# Patient Record
Sex: Female | Born: 1955 | Race: Black or African American | Hispanic: No | Smoking: Former smoker
Health system: Southern US, Community
[De-identification: ages and names within clinical notes are randomized; demographics above are authoritative.]

## PROBLEM LIST (undated history)

## (undated) DIAGNOSIS — I10 Essential (primary) hypertension: Secondary | ICD-10-CM

## (undated) DIAGNOSIS — I639 Cerebral infarction, unspecified: Secondary | ICD-10-CM

## (undated) DIAGNOSIS — E119 Type 2 diabetes mellitus without complications: Secondary | ICD-10-CM

## (undated) HISTORY — PX: OTHER SURGICAL HISTORY: SHX169

---

## 2009-04-25 IMAGING — CR DG CHEST 1V
1 series · 1 of 1 positions shown · non-contrast
Comparison: none

REASON FOR EXAM: SEVERE ABDOMINAL PAIN
COMMENTS:

PROCEDURE:     DXR - DXR CHEST 1 VIEWAP OR PA  - [DATE] [DATE]
RESULT:     PA erect view of the chest shows the lung fields to be clear.
The heart, mediastinal and osseous structures are normal appearance. No
subdiaphragmatic free air is seen.

[view not recorded]
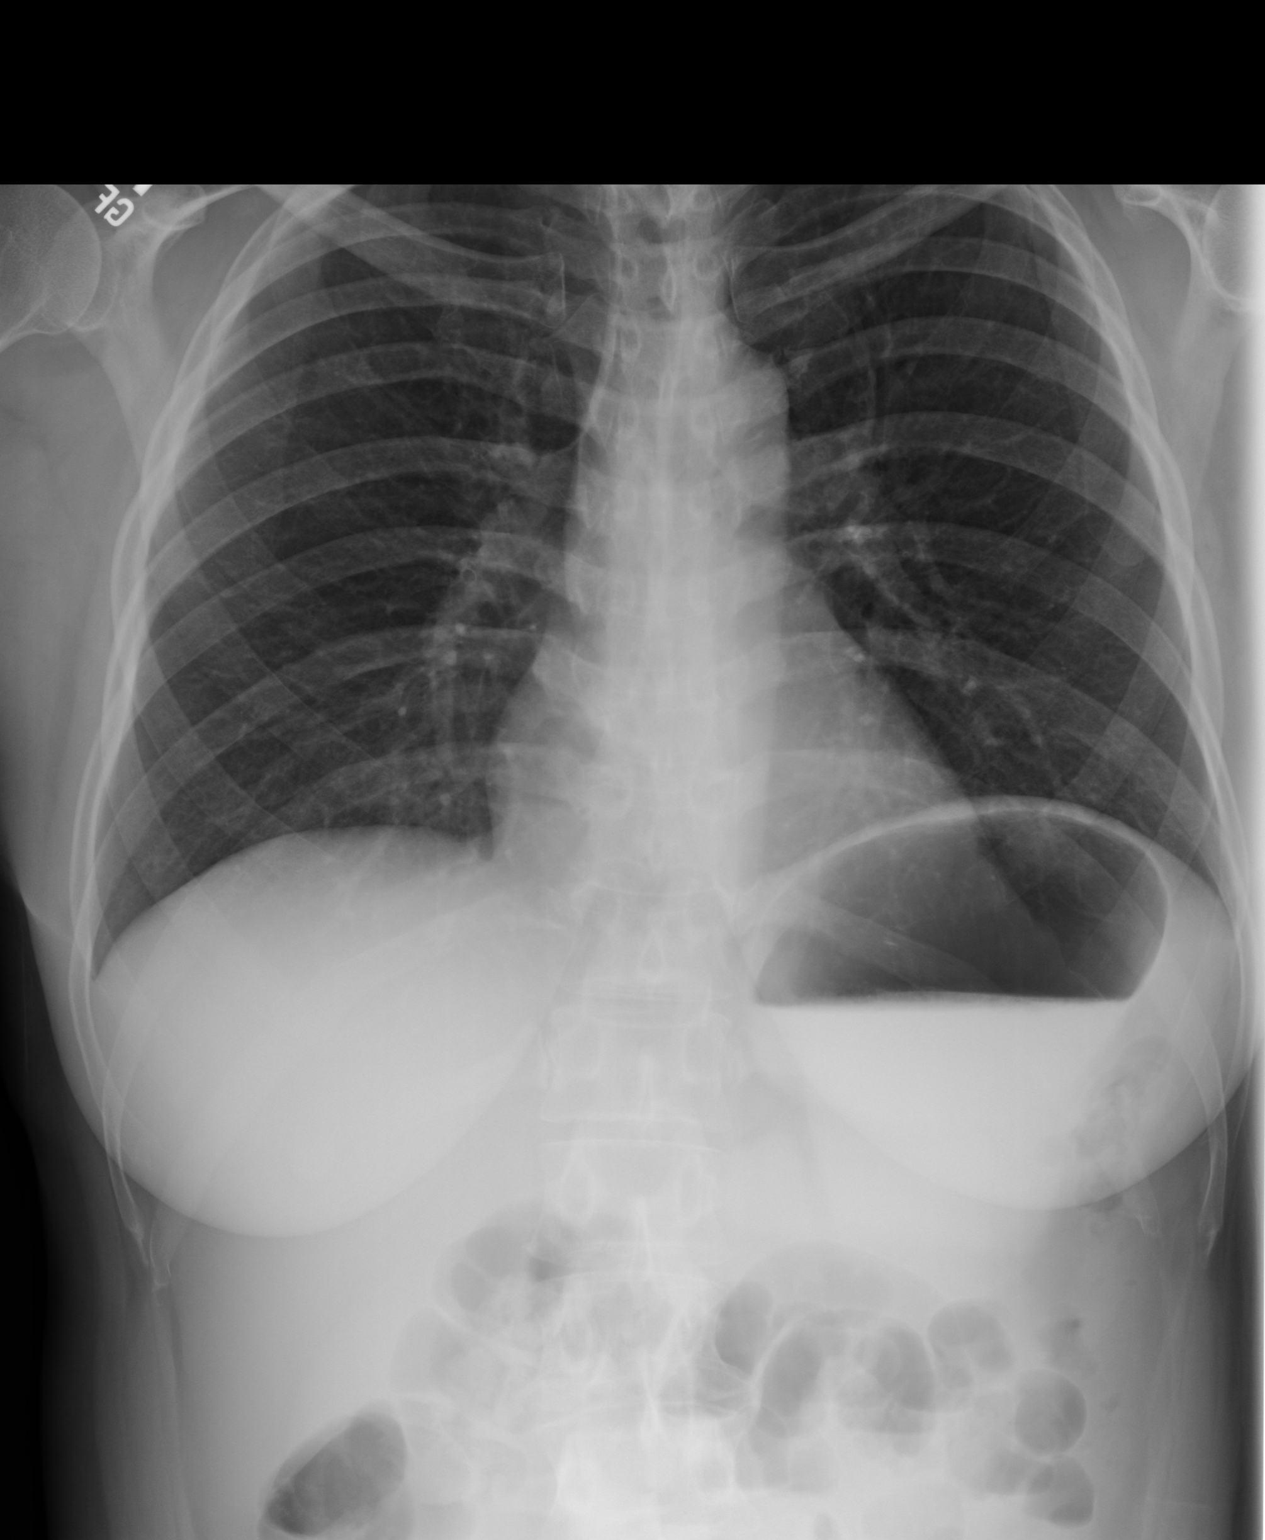

[1 of 1 positions shown; findings below may reference images not displayed]

IMPRESSION: No acute changes are identified.

## 2009-04-26 ENCOUNTER — Inpatient Hospital Stay: Payer: Self-pay | Admitting: Specialist

## 2009-04-26 IMAGING — CT CT ABD-PELV W/ CM
1 of 2 series · 14 of 32 positions shown, 18 images · non-contrast
Comparison: none

REASON FOR EXAM: (1) ABD PAIN; (2) ABD PAIN
COMMENTS:

[Series 2: abdomen · axial · 0.71mm/px · z∈[-1016,-602]mm · 14 of 95 slices shown, 18 images]
[im 8/95  soft-tissue]
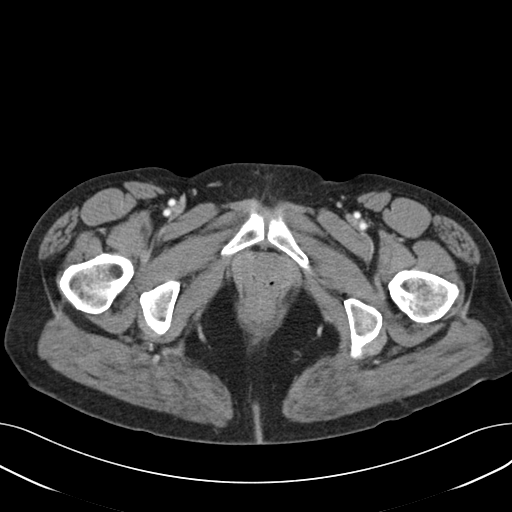
[im 8/95  bone]
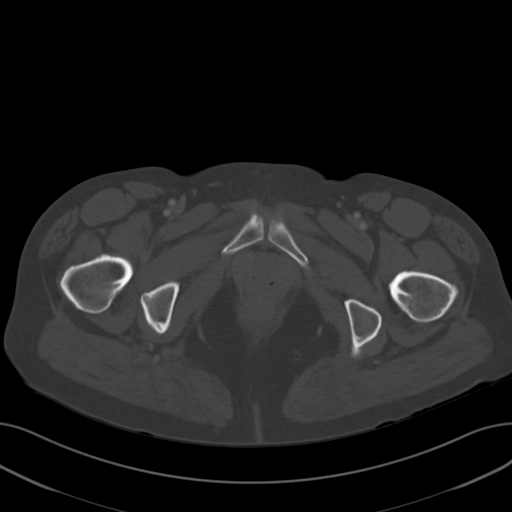
[im 16/95  soft-tissue]
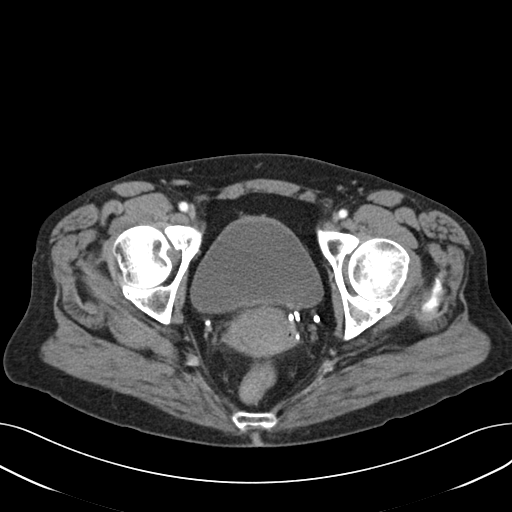
[im 23/95  soft-tissue]
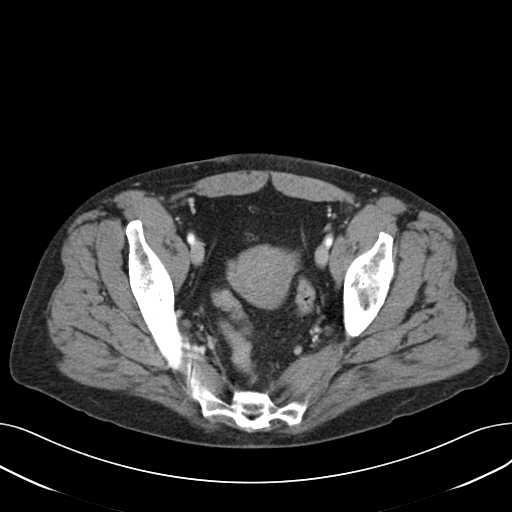
[im 31/95  soft-tissue]
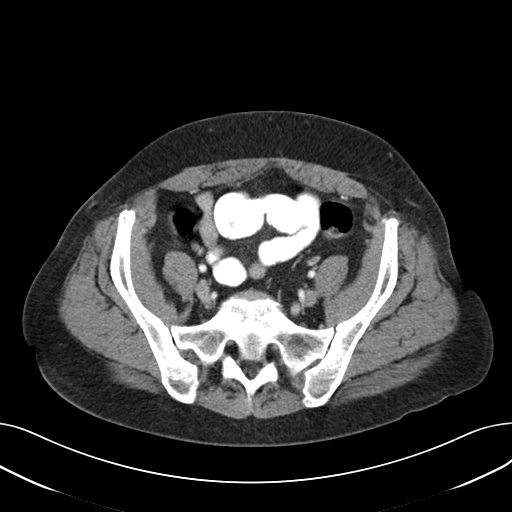
[im 38/95  soft-tissue]
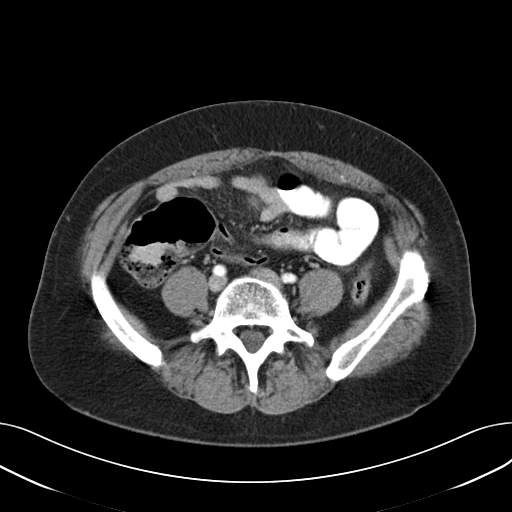
[im 46/95  soft-tissue]
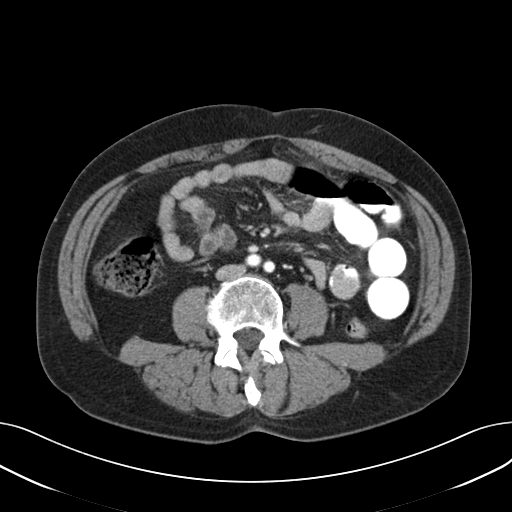
[im 53/95  soft-tissue]
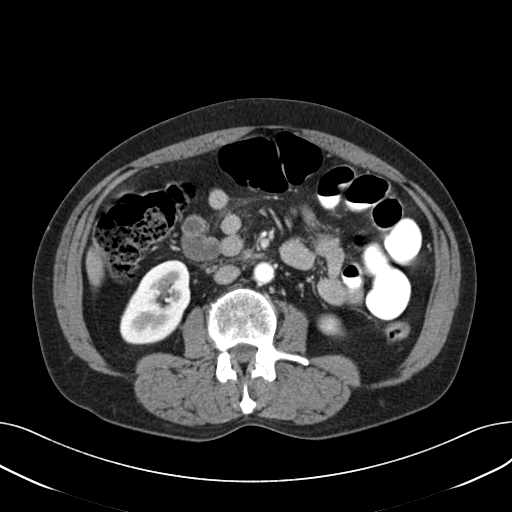
[im 61/95  soft-tissue]
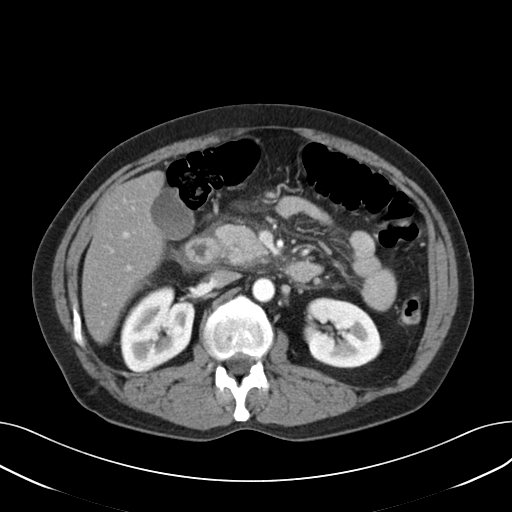
[im 68/95  soft-tissue]
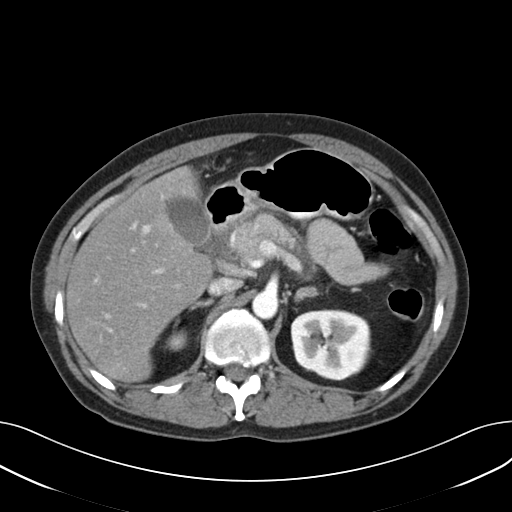
[im 68/95  bone]
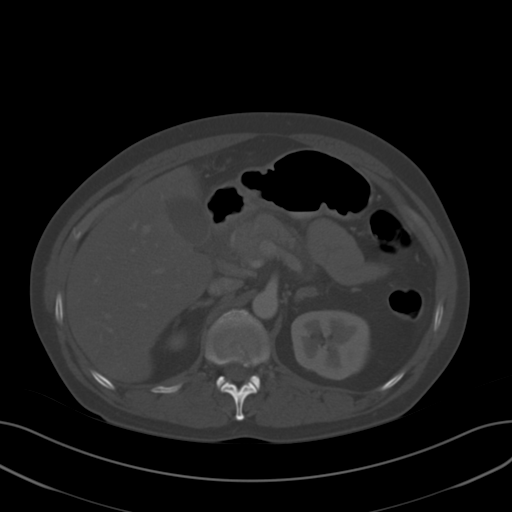
[im 76/95  soft-tissue]
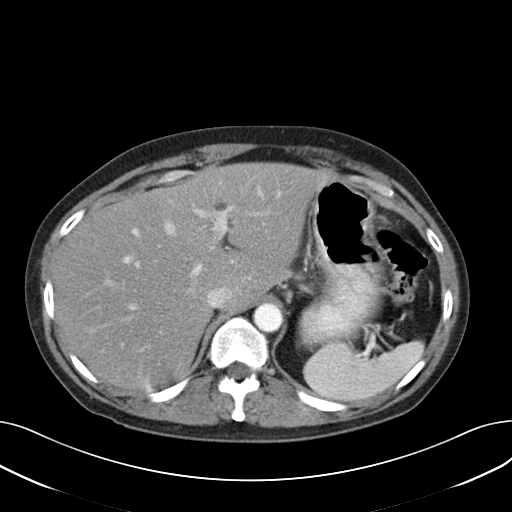
[im 79/95  lung]
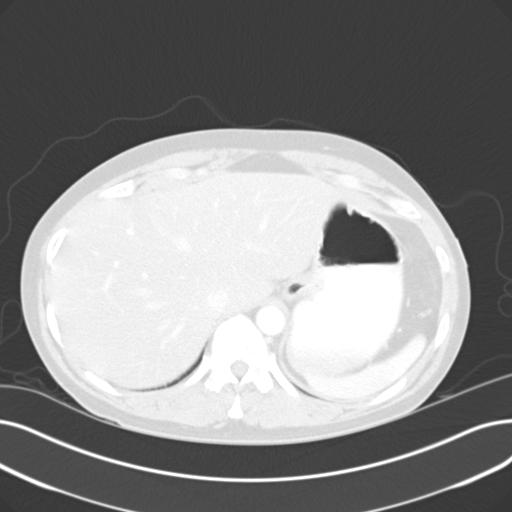
[im 83/95  soft-tissue]
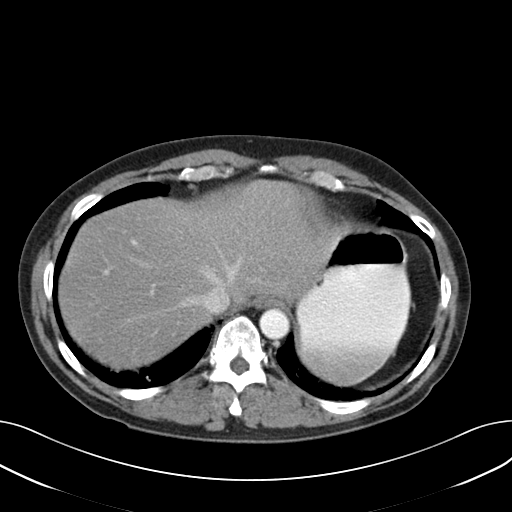
[im 83/95  lung]
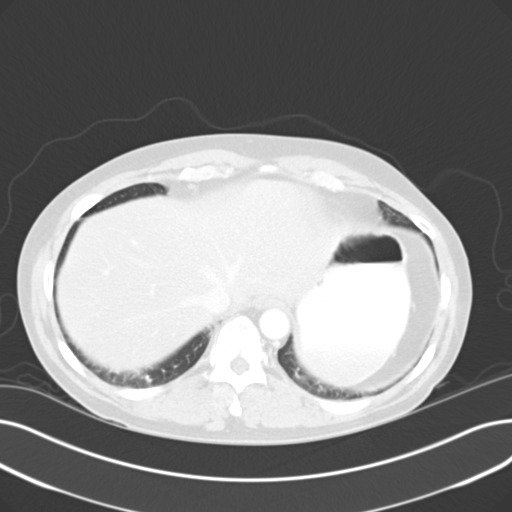
[im 87/95  lung]
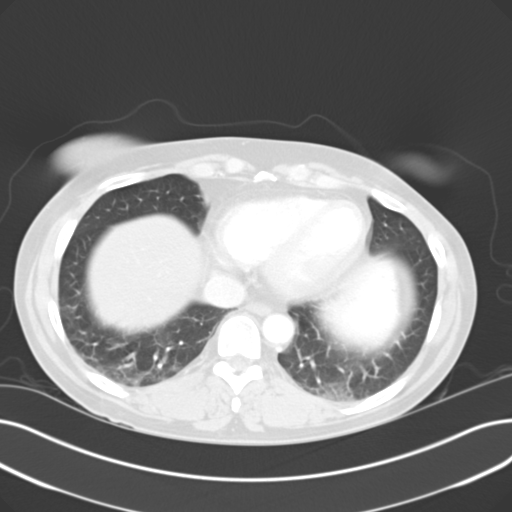
[im 91/95  soft-tissue]
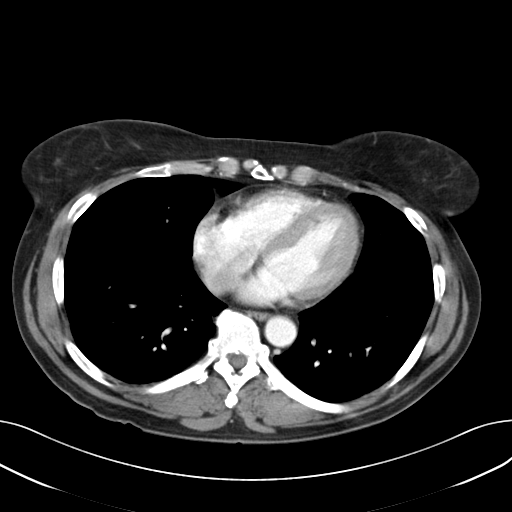
[im 91/95  lung]
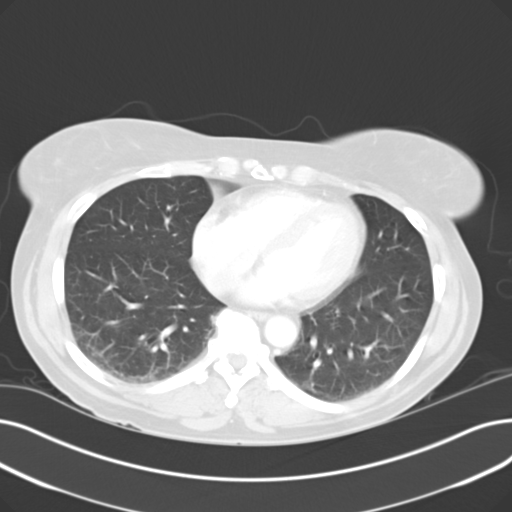

[14 of 32 positions shown; findings below may reference images not displayed]

PROCEDURE:     CT  - CT ABDOMEN / PELVIS  W  - [DATE]  [DATE]

RESULT:     Axial CT scanning was performed through the abdomen and pelvis
at 5 mm intervals and slice thicknesses following intravenous administration
of 100 cc of [G2]. Oral contrast was administered as well. Review of
3-dimensional reconstructed images was performed separately on the VIA
monitor.

There is edema of the pancreatic parenchyma. There is increased density in
the peripancreatic fat consistent with inflammation. There is no pancreatic
ductal dilation or focal pancreatic mass. A discrete pseudocyst is not
identified. Overall the appearance suggests that of acute pancreatitis. The
liver exhibits no focal mass nor ductal dilation. The hepatic parenchyma is
hypodense suggesting fatty infiltration. The gallbladder is adequately
distended with no evidence of calcified stones. The spleen, partially
distended stomach, adrenal glands, and kidneys exhibit no acute abnormality.
There is mild thickening of the wall of the descending portion of the
duodenum but no evidence of obstruction is seen.

The caliber of the abdominal aorta is normal. The periaortic and pericaval
regions are normal. The orally administered contrast has traversed
approximately one half of the small bowel. The pattern does not appear
obstructive however. The colon demonstrates a normal gas and stool pattern.
There is no evidence of free fluid in the pelvis. The urinary bladder,
uterus, and adnexal structures appear normal. There is likely uterine
fibroid exophytic from the posterior aspect of the uterus. There are
phleboliths within the pelvis.

The lumbar vertebral bodies are preserved in height. The lung bases exhibit
minimal dependent changes.
IMPRESSION: 1. The findings are consistent with acute pancreatitis with pancreatic
parenchymal edema fluid density in the peripancreatic fat. No pancreatic
masses identified.
2. There is mild thickening of the wall of the descending portion of the
duodenum. The duodenum is nonobstructive in appearance.
3. The small and large bowel do not exhibit evidence of obstruction
currently. The orally administered contrast has not traversed the small
bowel however.
4. There is no acute urinary tract abnormality nor acute abnormality of the
uterus or adnexal structure.

A pulmonary report was sent to the [HOSPITAL] the conclusion of
the study.

## 2015-05-14 ENCOUNTER — Encounter (HOSPITAL_COMMUNITY): Payer: Self-pay | Admitting: Emergency Medicine

## 2015-05-14 ENCOUNTER — Emergency Department (HOSPITAL_COMMUNITY)
Admission: EM | Admit: 2015-05-14 | Discharge: 2015-05-14 | Disposition: A | Discharge status: Home / Self Care | Payer: Self-pay | Attending: Emergency Medicine | Admitting: Emergency Medicine

## 2015-05-14 ENCOUNTER — Emergency Department (HOSPITAL_COMMUNITY): Payer: Self-pay

## 2015-05-14 DIAGNOSIS — L539 Erythematous condition, unspecified: Secondary | ICD-10-CM | POA: Insufficient documentation

## 2015-05-14 DIAGNOSIS — M7989 Other specified soft tissue disorders: Secondary | ICD-10-CM | POA: Insufficient documentation

## 2015-05-14 DIAGNOSIS — F17200 Nicotine dependence, unspecified, uncomplicated: Secondary | ICD-10-CM | POA: Insufficient documentation

## 2015-05-14 DIAGNOSIS — M1990 Unspecified osteoarthritis, unspecified site: Secondary | ICD-10-CM | POA: Insufficient documentation

## 2015-05-14 LAB — SYNOVIAL CELL COUNT + DIFF, W/ CRYSTALS
Crystals, Fluid: NONE SEEN
Eosinophils-Synovial: 0 % (ref 0–1)
Lymphocytes-Synovial Fld: 1 % (ref 0–20)
Monocyte-Macrophage-Synovial Fluid: 24 % — ABNORMAL LOW (ref 50–90)
Neutrophil, Synovial: 75 % — ABNORMAL HIGH (ref 0–25)
WBC, Synovial: 4270 /mm3 — ABNORMAL HIGH (ref 0–200)

## 2015-05-14 IMAGING — DX DG ANKLE COMPLETE 3+V*L*
3 series · 3 of 3 positions shown · non-contrast
Comparison: Or

CLINICAL DATA: 59-year-old female with acute onset left ankle
swelling without known injury

EXAM:
LEFT ANKLE COMPLETE - 3+ VIEW

[ankle ap]
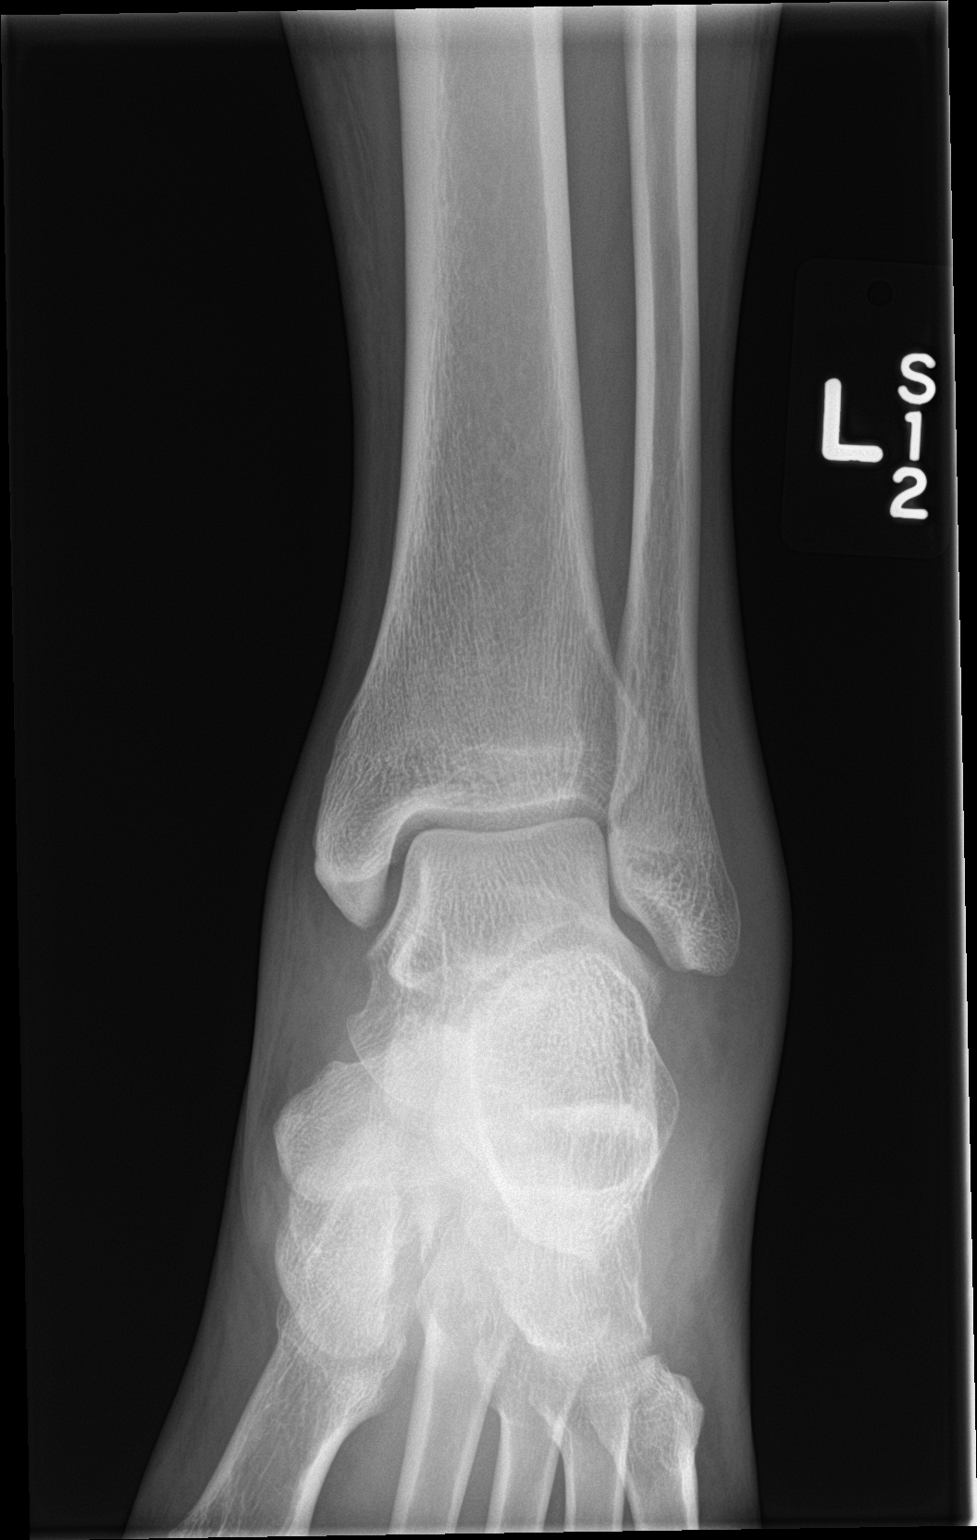

[ankle obl]
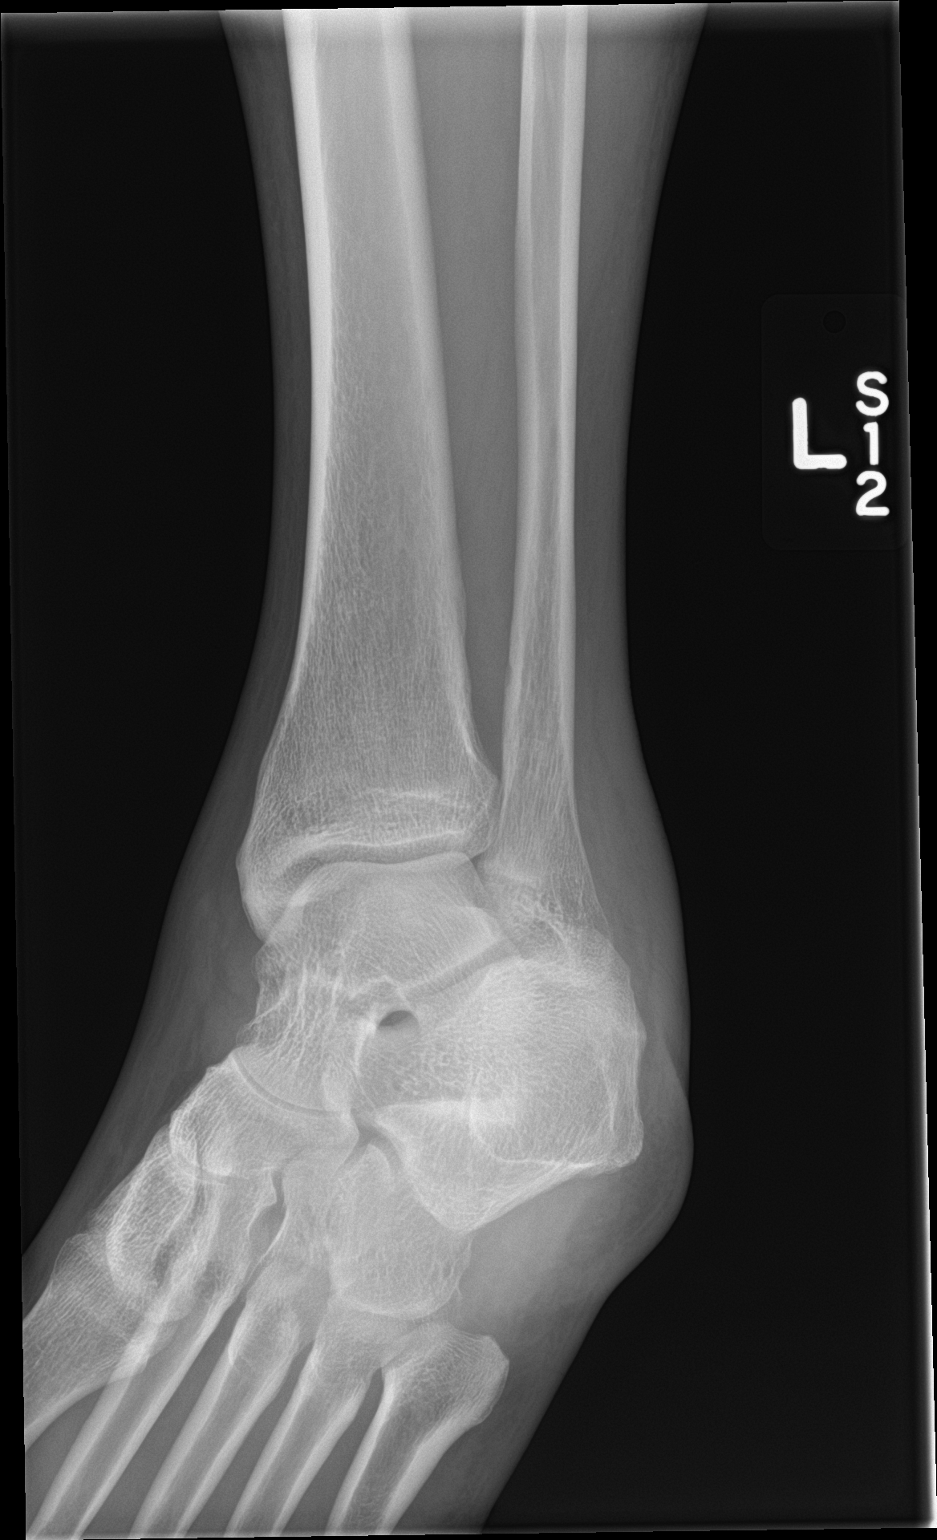

[ankle lat]
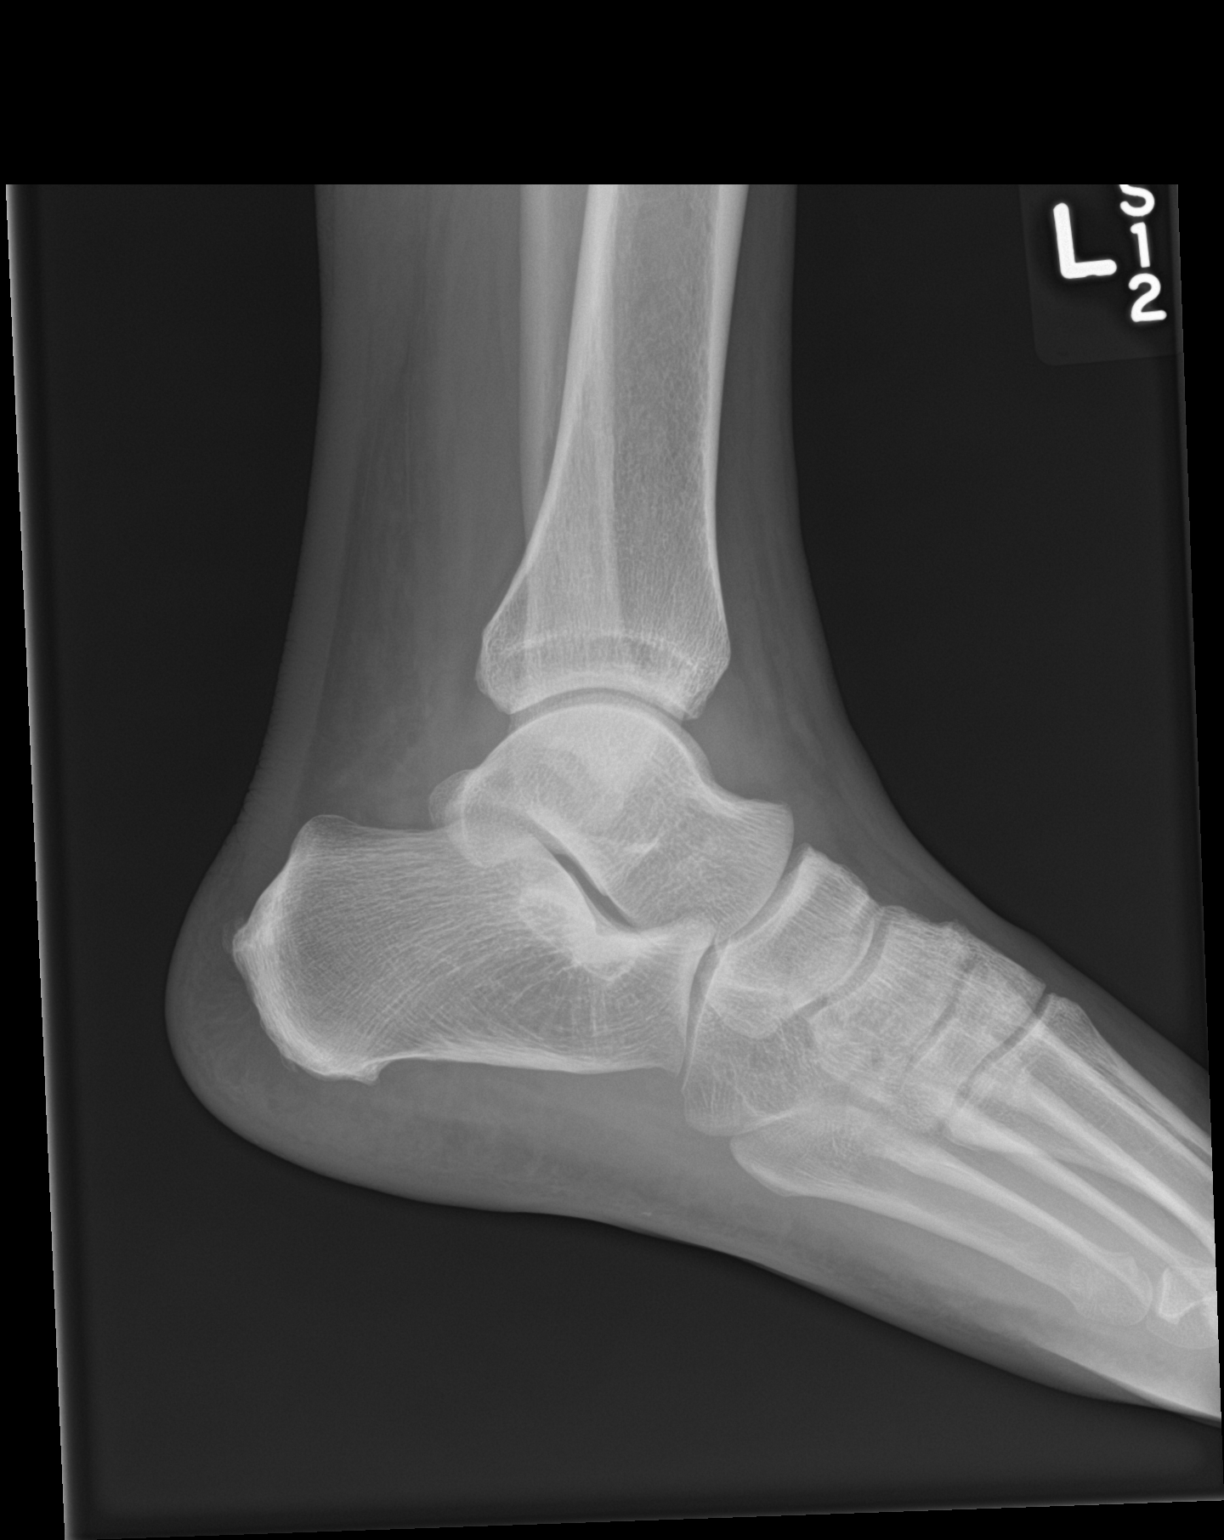

[3 of 3 positions shown; findings below may reference images not displayed]

FINDINGS: Positive for small ankle joint effusion. Diffuse mild soft tissue
swelling is present around the ankle. No evidence fracture,
malalignment or other bony abnormality. No degenerative or
destructive changes. Normal bony mineralization. No osseous lesions.
IMPRESSION: Suspect small ankle joint effusion with associated surrounding soft
tissue swelling.

No evidence of fracture, malalignment, degenerative change or
destructive changes. Differential considerations include
inflammatory effusion, early joint infection and potentially
hemarthrosis.

## 2015-05-14 MED ORDER — LIDOCAINE HCL (PF) 1 % IJ SOLN
5.0000 mL | Freq: Once | INTRAMUSCULAR | Status: AC
  Administered 2015-05-14: 5 mL via INTRADERMAL
  Filled 2015-05-14: qty 5

## 2015-05-14 MED ORDER — ACETAMINOPHEN 325 MG PO TABS
650.0000 mg | ORAL_TABLET | Freq: Once | ORAL | Status: AC
  Administered 2015-05-14: 650 mg via ORAL
  Filled 2015-05-14: qty 2

## 2015-05-14 MED ORDER — NAPROXEN 500 MG PO TABS: 500.0000 mg | ORAL_TABLET | Freq: Two times a day (BID) | ORAL | Status: DC

## 2015-05-14 MED ORDER — LORAZEPAM 2 MG/ML IJ SOLN
INTRAMUSCULAR | Status: AC
  Filled 2015-05-14: qty 1

## 2015-05-14 NOTE — Discharge Instructions (Signed)
Use naproxen for pain. Return to ED with worsening swelling, worsening pain, warmth of the ankle, redness at the ankle, fevers, chills, dizziness, change in mental status, nausea, vomiting or any other new, worsening or concerning symptoms. Schedule follow up with orthopedics for Monday.    Arthritis Arthritis is a term that is commonly used to refer to joint pain or joint disease. There are more than 100 types of arthritis. CAUSES The most common cause of this condition is wear and tear of a joint. Other causes include:  Gout.  Inflammation of a joint.  An infection of a joint.  Sprains and other injuries near the joint.  A drug reaction or allergic reaction. In some cases, the cause may not be known. SYMPTOMS The main symptom of this condition is pain in the joint with movement. Other symptoms include:  Redness, swelling, or stiffness at a joint.  Warmth coming from the joint.  Fever.  Overall feeling of illness. DIAGNOSIS This condition may be diagnosed with a physical exam and tests, including:  Blood tests.  Urine tests.  Imaging tests, such as MRI, X-rays, or a CT scan. Sometimes, fluid is removed from a joint for testing. TREATMENT Treatment for this condition may involve:  Treatment of the cause, if it is known.  Rest.  Raising (elevating) the joint.  Applying cold or hot packs to the joint.  Medicines to improve symptoms and reduce inflammation.  Injections of a steroid such as cortisone into the joint to help reduce pain and inflammation. Depending on the cause of your arthritis, you may need to make lifestyle changes to reduce stress on your joint. These changes may include exercising more and losing weight. HOME CARE INSTRUCTIONS Medicines  Take over-the-counter and prescription medicines only as told by your health care provider.  Do not take aspirin to relieve pain if gout is suspected. Activities  Rest your joint if told by your health care  provider. Rest is important when your disease is active and your joint feels painful, swollen, or stiff.  Avoid activities that make the pain worse. It is important to balance activity with rest.  Exercise your joint regularly with range-of-motion exercises as told by your health care provider. Try doing low-impact exercise, such as:  Swimming.  Water aerobics.  Biking.  Walking. Joint Care  If your joint is swollen, keep it elevated if told by your health care provider.  If your joint feels stiff in the morning, try taking a warm shower.  If directed, apply heat to the joint. If you have diabetes, do not apply heat without permission from your health care provider.  Put a towel between the joint and the hot pack or heating pad.  Leave the heat on the area for 20-30 minutes.  If directed, apply ice to the joint:  Put ice in a plastic bag.  Place a towel between your skin and the bag.  Leave the ice on for 20 minutes, 2-3 times per day.  Keep all follow-up visits as told by your health care provider. This is important. SEEK MEDICAL CARE IF:  The pain gets worse.  You have a fever. SEEK IMMEDIATE MEDICAL CARE IF:  You develop severe joint pain, swelling, or redness.  Many joints become painful and swollen.  You develop severe back pain.  You develop severe weakness in your leg.  You cannot control your bladder or bowels.   This information is not intended to replace advice given to you by your health care  provider. Make sure you discuss any questions you have with your health care provider.   Document Released: 02/16/2004 Document Revised: 09/29/2014 Document Reviewed: 04/05/2014 Elsevier Interactive Patient Education [employer]

## 2015-05-14 NOTE — ED Notes (Signed)
Pt wants to go home .

## 2015-05-14 NOTE — ED Notes (Signed)
PT woke up yesterday with swelling over left ankle. No known injury. Swollen area is warm and non pitting. Tender to palpation. PT could not bear weight on extremity yesterday, but is tolerating limited weight today.

## 2015-05-14 NOTE — ED Provider Notes (Signed)
CSN: [id number]     Arrival date & time 05/14/15  1001 History   First MD Initiated Contact with Patient 05/14/15 1053     Chief Complaint  Patient presents with  . Ankle Pain   HPI  Ms. Brandi Rojas is a 60 year old female with no known past medical history presenting with left ankle pain and swelling. Patient reports onset of pain and swelling approximately one week ago. This worsened yesterday and she was unable to bear weight on the ankle. She states that today it has somewhat improved and she is able to limp around. The pain is located over the lateral aspect of her ankle. She states that it is mild-moderate in severity and aches. Pain does not radiate into the foot or lower leg. She is unsure if it has felt warm; states that maybe it has. She does believe that her skin looks slightly more pink over her left ankle compared to the right. She denies known trauma to the ankle. Denies history of gout or other arthritic shins. Denies medical problems or daily medications. She has not tried any medicines at home for this. Denies systemic symptoms including fever, chills, nausea or vomiting. She has no other complaints today.  History reviewed. No pertinent past medical history. History reviewed. No pertinent past surgical history. No family history on file. Social History  Substance Use Topics  . Smoking status: Current Every Day Smoker -- 0.50 packs/day  . Smokeless tobacco: None  . Alcohol Use: 4.2 oz/week    7 Cans of beer per week   OB History    No data available     Review of Systems  All other systems reviewed and are negative.     Allergies  Review of patient's allergies indicates no known allergies.  Home Medications   Prior to Admission medications   Medication Sig Start Date End Date Taking? Authorizing Provider  naproxen (NAPROSYN) 500 MG tablet Take 1 tablet (500 mg total) by mouth 2 (two) times daily. 05/14/15   Makinze Jani, PA-C   BP 137/93 mmHg  Pulse 72  Temp(Src)  98.8 F (37.1 C) (Oral)  Resp 18  SpO2 99%  LMP  Physical Exam  Constitutional: She appears well-developed and well-nourished. No distress.  Nontoxic-appearing  HENT:  Head: Normocephalic and atraumatic.  Right Ear: External ear normal.  Left Ear: External ear normal.  Eyes: Conjunctivae are normal. Right eye exhibits no discharge. Left eye exhibits no discharge. No scleral icterus.  Neck: Normal range of motion.  Cardiovascular: Normal rate and intact distal pulses.   Pedal pulse palpable  Pulmonary/Chest: Effort normal.  Musculoskeletal: Normal range of motion.  Tenderness to palpation of the lateral aspect of the left ankle. Moderate amount of swelling noted when compared to right. No pitting edema. Limited range of motion at the ankle secondary to pain. Full passive range of motion intact. No appreciable warmth of the joint. Extremely faint erythema over the lateral ankle. Remaining joints of the left lower extremity are nontender, without swelling and with full range of motion.  Neurological: She is alert. Coordination normal.  5/5 strength at the bilateral ankles. Sensation to light touch intact throughout.  Skin: Skin is warm and dry.  Psychiatric: She has a normal mood and affect. Her behavior is normal.  Nursing note and vitals reviewed.   ED Course  Procedures (including critical care time) Labs Review Labs Reviewed  SYNOVIAL CELL COUNT + DIFF, W/ CRYSTALS - Abnormal; Notable for the following:    Appearance-Synovial  TURBID (*)    WBC, Synovial 4270 (*)    Neutrophil, Synovial 75 (*)    Monocyte-Macrophage-Synovial Fluid 24 (*)    All other components within normal limits  BODY FLUID CULTURE  GLUCOSE, SYNOVIAL FLUID  RAPID HIV SCREEN (HIV 1/2 AB+AG)  HEPATITIS B SURFACE ANTIGEN  HEPATITIS C ANTIBODY (REFLEX)    Imaging Review Dg Ankle Complete Left  05/14/2015  CLINICAL DATA:  60 year old female with acute onset left ankle swelling without known injury EXAM:  LEFT ANKLE COMPLETE - 3+ VIEW COMPARISON:  Or FINDINGS: Positive for small ankle joint effusion. Diffuse mild soft tissue swelling is present around the ankle. No evidence fracture, malalignment or other bony abnormality. No degenerative or destructive changes. Normal bony mineralization. No osseous lesions. IMPRESSION: Suspect small ankle joint effusion with associated surrounding soft tissue swelling. No evidence of fracture, malalignment, degenerative change or destructive changes. Differential considerations include inflammatory effusion, early joint infection and potentially hemarthrosis. Electronically Signed   By: Malachy Moan M.D.   On: 05/14/2015 10:42   I have personally reviewed and evaluated these images and lab results as part of my medical decision-making.   EKG Interpretation None     During aspiration, accidental needle stick. Precautions and safety zone initiated.  MDM   Final diagnoses:  Inflammatory arthritis (HCC)   60 year old female presenting with left ankle pain and swelling 1 week. Afebrile and nontoxic appearing. Tenderness over lateral aspect of the ankle with moderate amount of swelling. No warmth or erythema. Restricted range of motion secondary to pain. Left foot is neurovascularly intact. X-ray reading shows small ankle joint effusion that could be consistent with inflammatory effusion, joint infection or hemarthrosis. Joint aspiration performed by Dr. Verdie Mosher. Cell count shows 4000 white blood cells and 75 neutrophils. Presentation and aspirate not consistent with septic arthritis. Likely has inflammatory arthritis. Will discharge with NSAIDs and symptomatic care. Pt is to follow up with orthopedics in 2 days. I have also given strict return precautions. Patient case reviewed with Dr. Verdie Mosher who agrees with assessment and plan. Return precautions given in discharge paperwork and discussed with pt at bedside. Pt stable for discharge     Alveta Heimlich, PA-C 05/14/15  1623  Lavera Guise, MD 05/14/15 [id number]

## 2015-05-15 LAB — GLUCOSE, SYNOVIAL FLUID: Glucose, Synovial Fluid: <20 mg/dL

## 2015-05-18 LAB — BODY FLUID CULTURE
Culture: NO GROWTH
Special Requests: NORMAL

## 2017-11-26 IMAGING — US BILATERAL CAROTID DUPLEX ULTRASOUND
1 series · 13 of 24 positions shown · non-contrast
Comparison: None.

CLINICAL DATA: Cerebral infarction.

EXAM:
BILATERAL CAROTID DUPLEX ULTRASOUND
TECHNIQUE: Gray scale imaging, color Doppler and duplex ultrasound were
performed of bilateral carotid and vertebral arteries in the neck.

[Series 1: bilateral carotid duplex ultrasound · 13 of 64 slices shown]
[im 1/64]
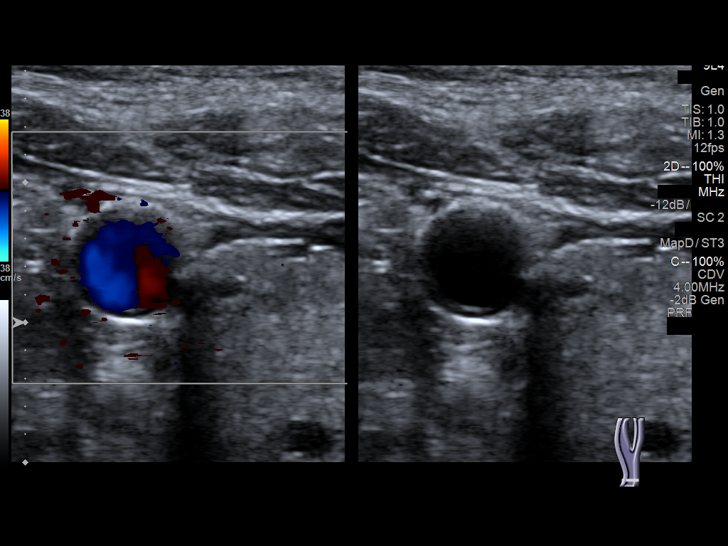
[im 6/64]
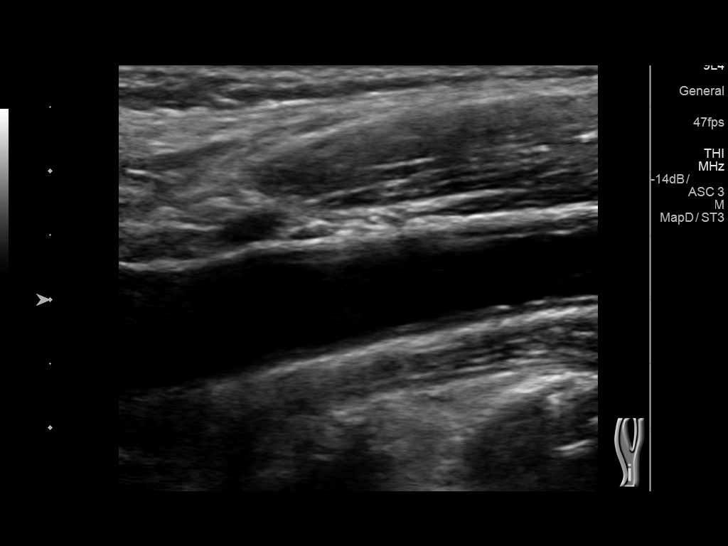
[im 11/64]
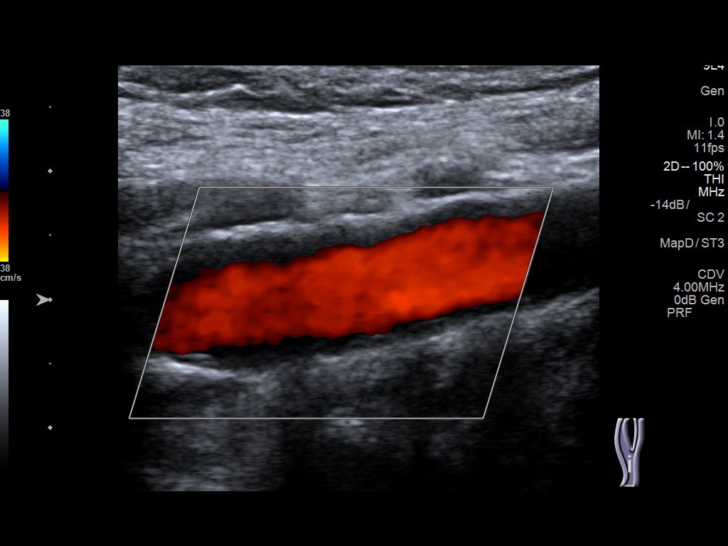
[im 17/64]
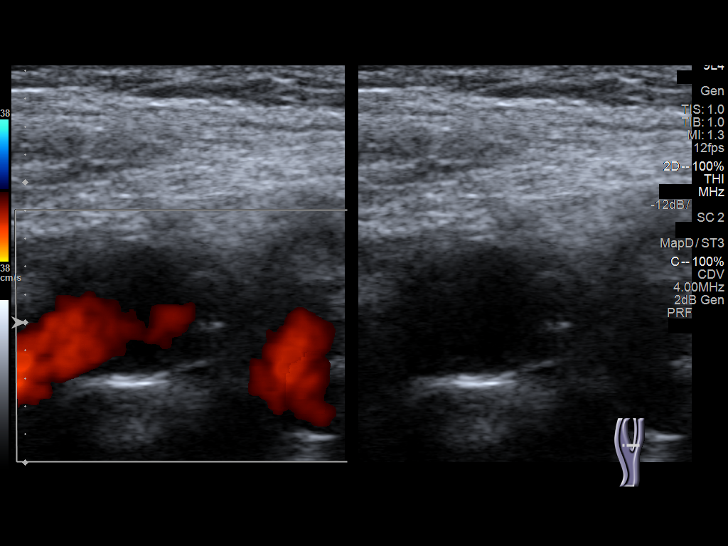
[im 22/64]
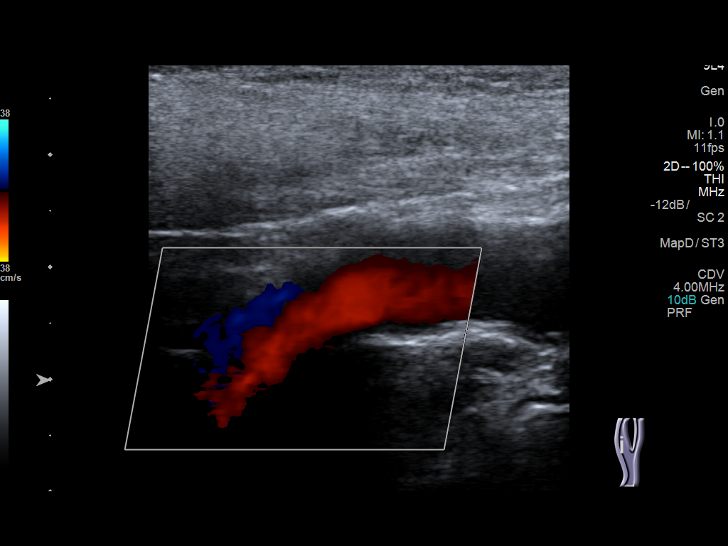
[im 28/64]
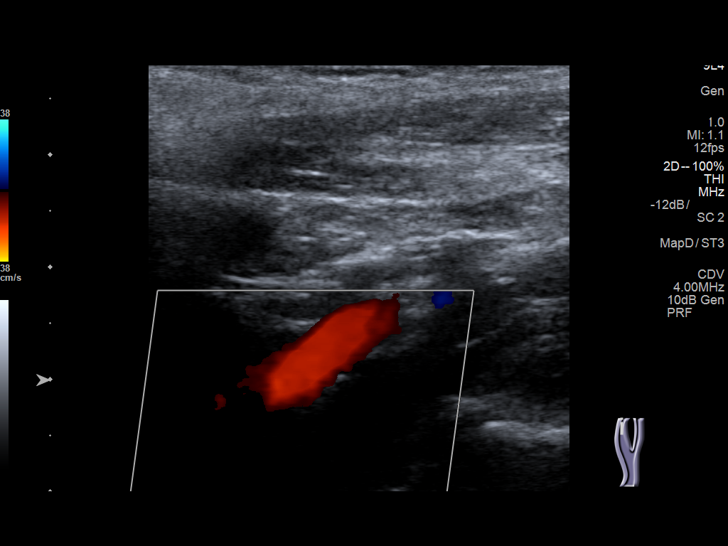
[im 33/64]
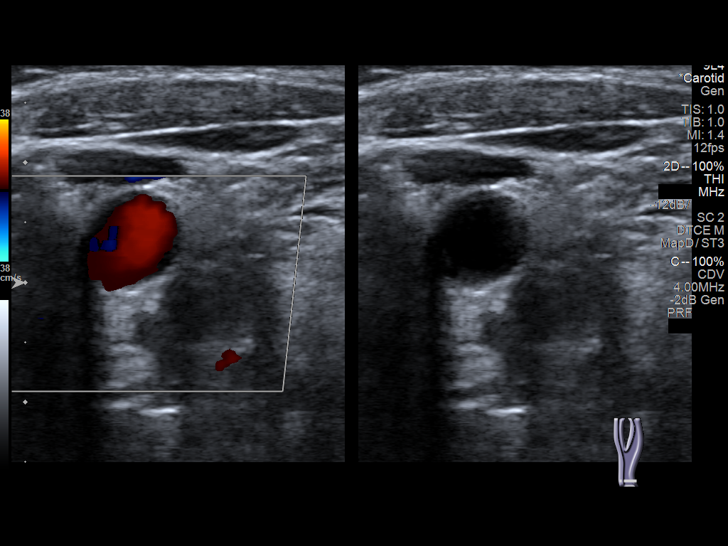
[im 36/64]
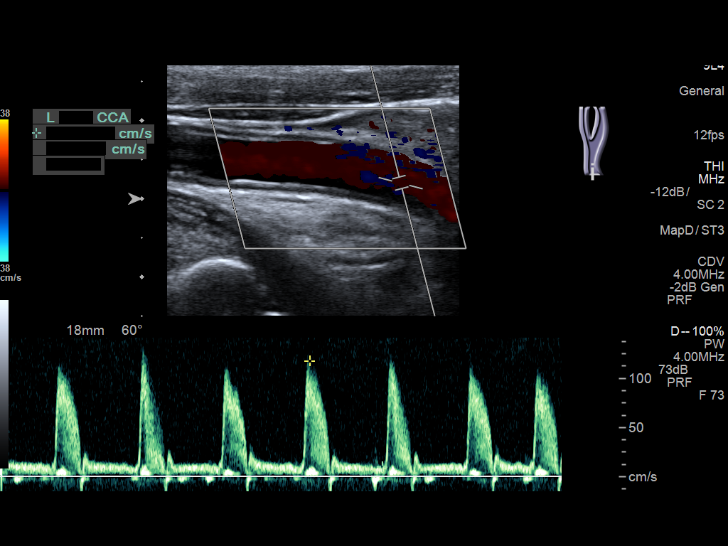
[im 42/64]
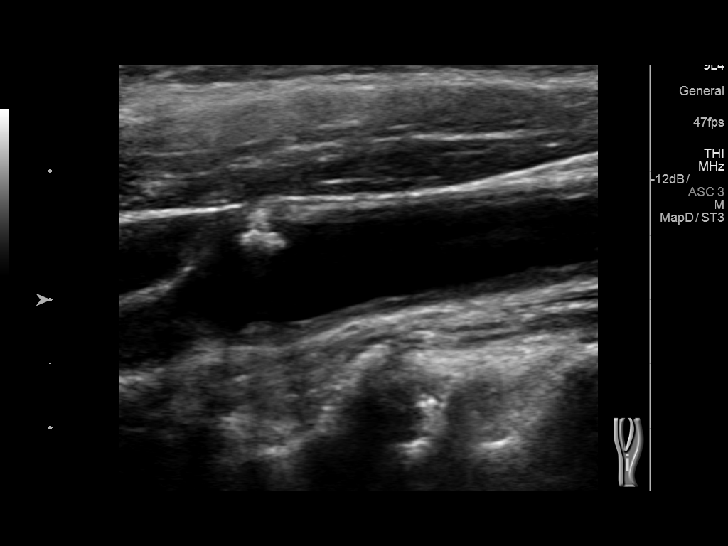
[im 47/64]
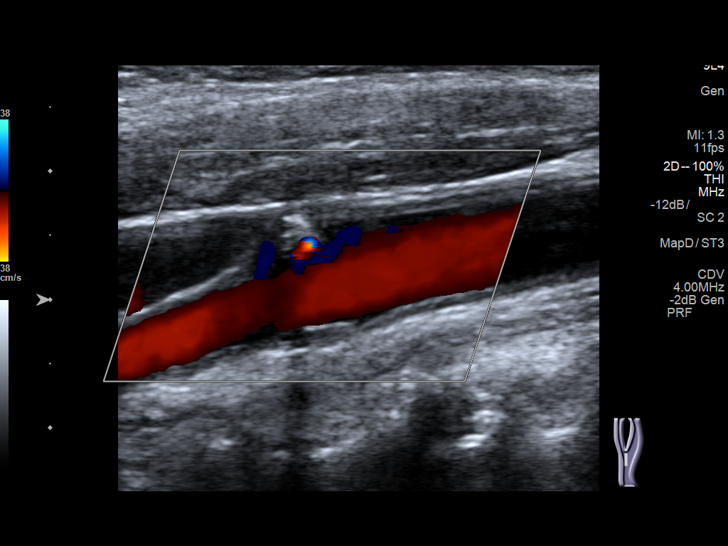
[im 53/64]
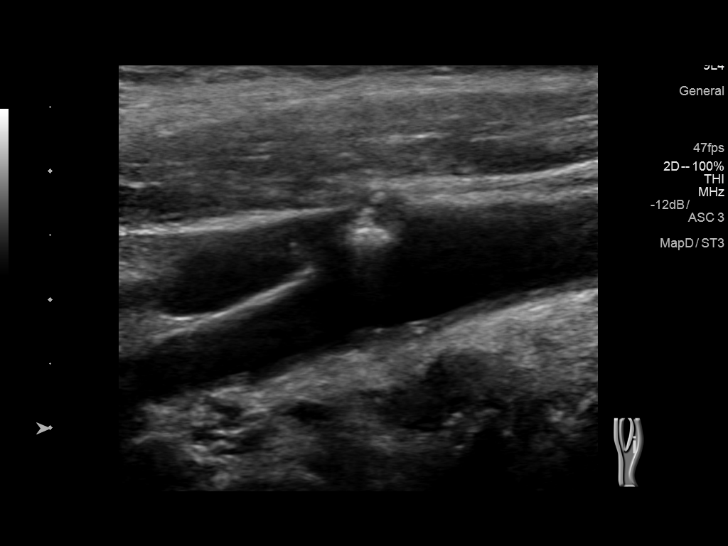
[im 58/64]
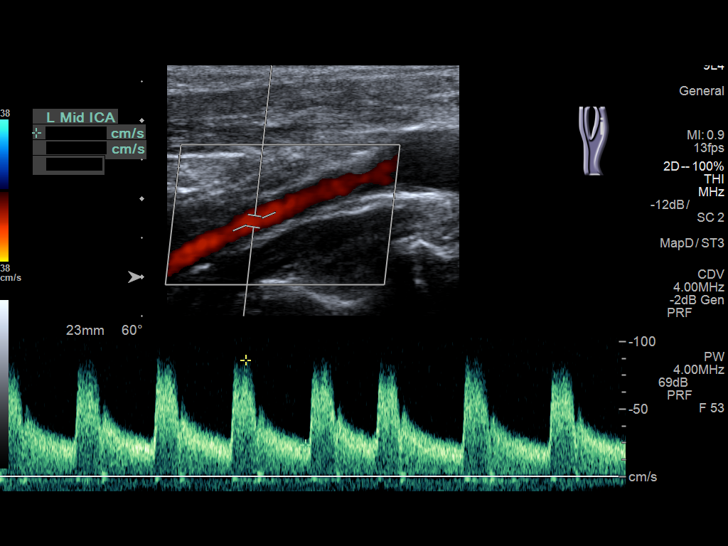
[im 64/64]
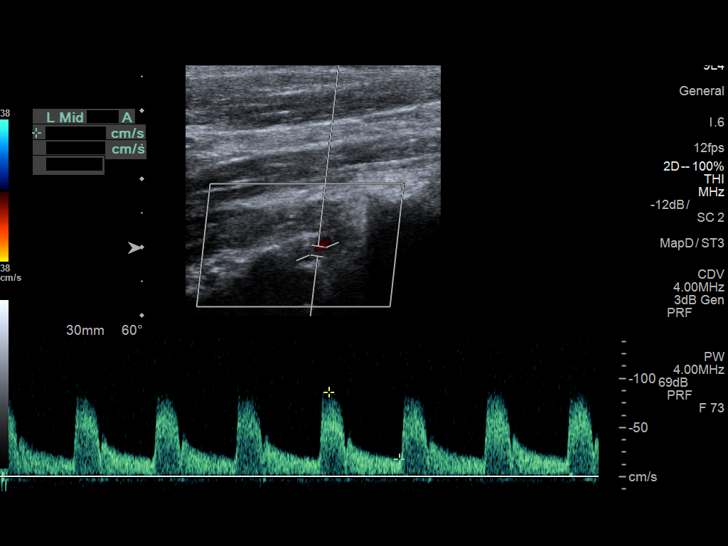

[13 of 24 positions shown; findings below may reference images not displayed]

FINDINGS: Criteria: Quantification of carotid stenosis is based on velocity
parameters that correlate the residual internal carotid diameter
with NASCET-based stenosis levels, using the diameter of the distal
internal carotid lumen as the denominator for stenosis measurement.

The following velocity measurements were obtained:

RIGHT

ICA:  117/35 cm/sec

CCA:  71/13 cm/sec

SYSTOLIC ICA/CCA RATIO:

ECA:  60 cm/sec

LEFT

ICA:  105/32 cm/sec

CCA:  80/13 cm/sec

SYSTOLIC ICA/CCA RATIO:

ECA:  75 cm/sec

RIGHT CAROTID ARTERY: Mild amount of partially calcified plaque is
present at the level of the distal common carotid artery, carotid
bulb and proximal right ICA. Estimated right ICA stenosis is less
than 50%.

RIGHT VERTEBRAL ARTERY: Antegrade flow with normal waveform and
velocity.

LEFT CAROTID ARTERY: Mild amount of predominately calcified plaque
is present at the level of the left carotid bulb and proximal left
ICA. Estimated left ICA stenosis is less than 50%.

LEFT VERTEBRAL ARTERY: Antegrade flow with normal waveform and
velocity.
IMPRESSION: Mild amount of plaque at both carotid bulbs and proximal internal
carotid arteries. Estimated bilateral ICA stenoses are less than
50%.

## 2018-04-18 ENCOUNTER — Inpatient Hospital Stay
Admission: EM | Admit: 2018-04-18 | Discharge: 2018-04-19 | DRG: 066 | Disposition: A | Discharge status: Home / Self Care | Payer: Self-pay | Attending: Internal Medicine | Admitting: Internal Medicine

## 2018-04-18 ENCOUNTER — Inpatient Hospital Stay
Admit: 2018-04-18 | Discharge: 2018-04-18 | Disposition: A | Discharge status: Home / Self Care | Payer: Self-pay | Attending: Neurology | Admitting: Neurology

## 2018-04-18 ENCOUNTER — Encounter: Payer: Self-pay | Admitting: Emergency Medicine

## 2018-04-18 ENCOUNTER — Other Ambulatory Visit: Payer: Self-pay

## 2018-04-18 ENCOUNTER — Inpatient Hospital Stay: Payer: Self-pay

## 2018-04-18 ENCOUNTER — Emergency Department: Payer: Self-pay

## 2018-04-18 ENCOUNTER — Observation Stay: Payer: Self-pay

## 2018-04-18 DIAGNOSIS — I6523 Occlusion and stenosis of bilateral carotid arteries: Secondary | ICD-10-CM | POA: Diagnosis present

## 2018-04-18 DIAGNOSIS — I639 Cerebral infarction, unspecified: Principal | ICD-10-CM | POA: Diagnosis present

## 2018-04-18 DIAGNOSIS — E119 Type 2 diabetes mellitus without complications: Secondary | ICD-10-CM | POA: Diagnosis present

## 2018-04-18 DIAGNOSIS — F17210 Nicotine dependence, cigarettes, uncomplicated: Secondary | ICD-10-CM | POA: Diagnosis present

## 2018-04-18 DIAGNOSIS — R29810 Facial weakness: Secondary | ICD-10-CM | POA: Diagnosis present

## 2018-04-18 DIAGNOSIS — R29704 NIHSS score 4: Secondary | ICD-10-CM | POA: Diagnosis present

## 2018-04-18 DIAGNOSIS — I10 Essential (primary) hypertension: Secondary | ICD-10-CM | POA: Diagnosis present

## 2018-04-18 DIAGNOSIS — Z791 Long term (current) use of non-steroidal anti-inflammatories (NSAID): Secondary | ICD-10-CM

## 2018-04-18 DIAGNOSIS — G459 Transient cerebral ischemic attack, unspecified: Secondary | ICD-10-CM

## 2018-04-18 LAB — COMPREHENSIVE METABOLIC PANEL
ALT: 19 U/L (ref 0–44)
AST: 26 U/L (ref 15–41)
Albumin: 4.2 g/dL (ref 3.5–5.0)
Alkaline Phosphatase: 83 U/L (ref 38–126)
Anion gap: 14 (ref 5–15)
BUN: 12 mg/dL (ref 8–23)
CO2: 24 mmol/L (ref 22–32)
Calcium: 9.1 mg/dL (ref 8.9–10.3)
Chloride: 100 mmol/L (ref 98–111)
Creatinine, Ser: 0.69 mg/dL (ref 0.44–1.00)
GFR calc Af Amer: >60 mL/min (ref >60)
GFR calc non Af Amer: >60 mL/min (ref >60)
Glucose, Bld: 273 mg/dL — ABNORMAL HIGH (ref 70–99)
Potassium: 4.4 mmol/L (ref 3.5–5.1)
Sodium: 138 mmol/L (ref 135–145)
Total Bilirubin: 1.2 mg/dL (ref 0.3–1.2)
Total Protein: 8 g/dL (ref 6.5–8.1)

## 2018-04-18 LAB — CBC
HCT: 48.6 % — ABNORMAL HIGH (ref 36.0–46.0)
Hemoglobin: 15.7 g/dL — ABNORMAL HIGH (ref 12.0–15.0)
MCH: 27.1 pg (ref 26.0–34.0)
MCHC: 32.3 g/dL (ref 30.0–36.0)
MCV: 83.9 fL (ref 80.0–100.0)
Platelets: 273 10*3/uL (ref 150–400)
RBC: 5.79 MIL/uL — ABNORMAL HIGH (ref 3.87–5.11)
RDW: 12.9 % (ref 11.5–15.5)
WBC: 8.3 10*3/uL (ref 4.0–10.5)
nRBC: 0 % (ref 0.0–0.2)

## 2018-04-18 LAB — LIPID PANEL
Cholesterol: 276 mg/dL — ABNORMAL HIGH (ref 0–200)
HDL: 40 mg/dL — ABNORMAL LOW (ref >40)
LDL Cholesterol: 173 mg/dL — ABNORMAL HIGH (ref 0–99)
Total CHOL/HDL Ratio: 6.9 RATIO
Triglycerides: 317 mg/dL — ABNORMAL HIGH (ref <150)
VLDL: 63 mg/dL — ABNORMAL HIGH (ref 0–40)

## 2018-04-18 LAB — DIFFERENTIAL
Abs Immature Granulocytes: 0.03 10*3/uL (ref 0.00–0.07)
Basophils Absolute: 0 10*3/uL (ref 0.0–0.1)
Basophils Relative: 0 %
Eosinophils Absolute: 0.1 10*3/uL (ref 0.0–0.5)
Eosinophils Relative: 1 %
Immature Granulocytes: 0 %
Lymphocytes Relative: 39 %
Lymphs Abs: 3.2 10*3/uL (ref 0.7–4.0)
Monocytes Absolute: 0.4 10*3/uL (ref 0.1–1.0)
Monocytes Relative: 5 %
Neutro Abs: 4.5 10*3/uL (ref 1.7–7.7)
Neutrophils Relative %: 55 %

## 2018-04-18 LAB — TROPONIN I: Troponin I: <0.03 ng/mL (ref <0.03)

## 2018-04-18 LAB — URINE DRUG SCREEN, QUALITATIVE (ARMC ONLY)
Amphetamines, Ur Screen: NOT DETECTED
Barbiturates, Ur Screen: NOT DETECTED
Benzodiazepine, Ur Scrn: NOT DETECTED
Cannabinoid 50 Ng, Ur ~~LOC~~: POSITIVE — AB
Cocaine Metabolite,Ur ~~LOC~~: NOT DETECTED
MDMA (Ecstasy)Ur Screen: NOT DETECTED
Methadone Scn, Ur: NOT DETECTED
Opiate, Ur Screen: NOT DETECTED
Phencyclidine (PCP) Ur S: NOT DETECTED
Tricyclic, Ur Screen: NOT DETECTED

## 2018-04-18 LAB — PROTIME-INR
INR: 0.9 (ref 0.8–1.2)
Prothrombin Time: 12 seconds (ref 11.4–15.2)

## 2018-04-18 LAB — APTT: aPTT: 25 seconds (ref 24–36)

## 2018-04-18 LAB — GLUCOSE, CAPILLARY
Glucose-Capillary: 269 mg/dL — ABNORMAL HIGH (ref 70–99)
Glucose-Capillary: 214 mg/dL — ABNORMAL HIGH (ref 70–99)

## 2018-04-18 IMAGING — CT CT HEAD CODE STROKE W/O CM
3 series · 15 of 44 positions shown, 18 images · non-contrast
Comparison: None.

CLINICAL DATA: Code stroke.  Right facial droop

EXAM:
CT HEAD WITHOUT CONTRAST
TECHNIQUE: Contiguous axial images were obtained from the base of the skull
through the vertex without intravenous contrast.

[Series 2: head wo · axial · 0.47mm/px · z∈[+979,+1089]mm · 9 of 27 slices shown, 12 images]
[im 3/27  brain]
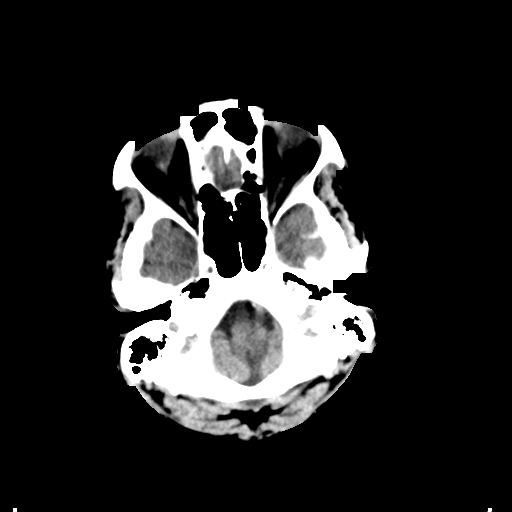
[im 3/27  bone]
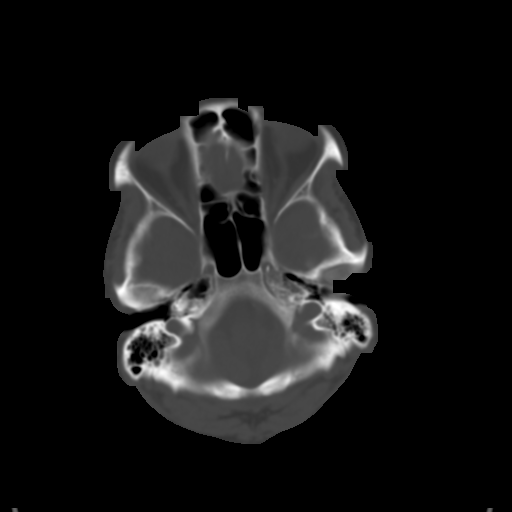
[im 6/27  brain]
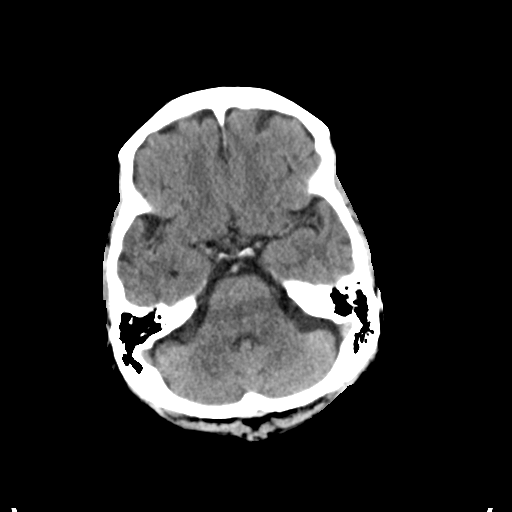
[im 8/27  brain]
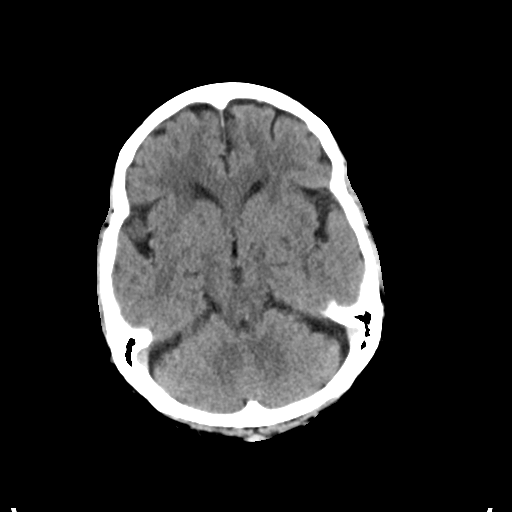
[im 11/27  brain]
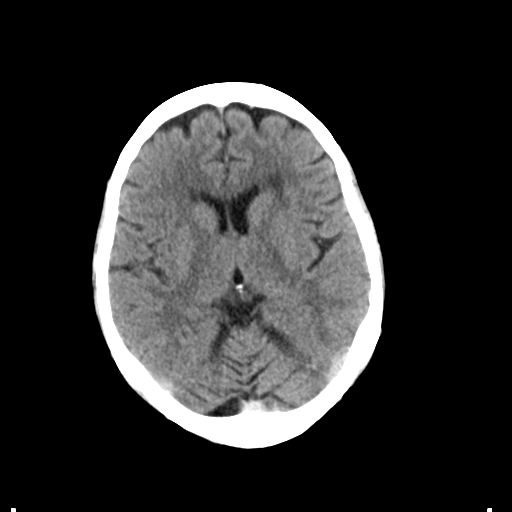
[im 14/27  brain]
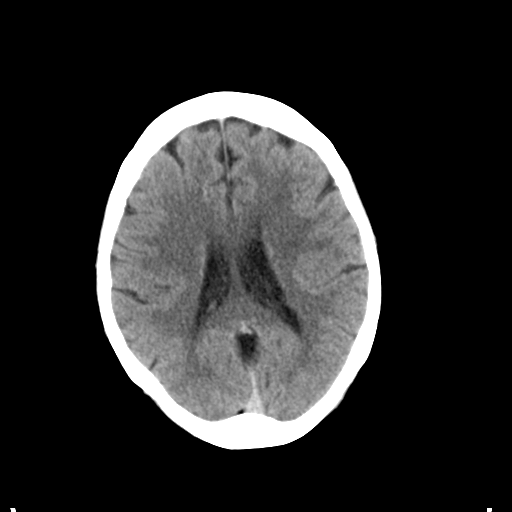
[im 14/27  bone]
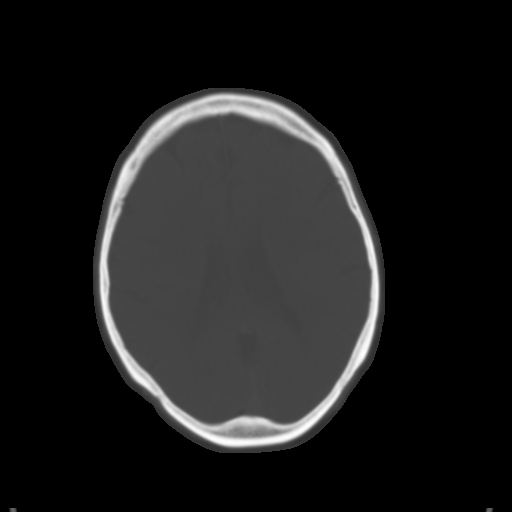
[im 17/27  brain]
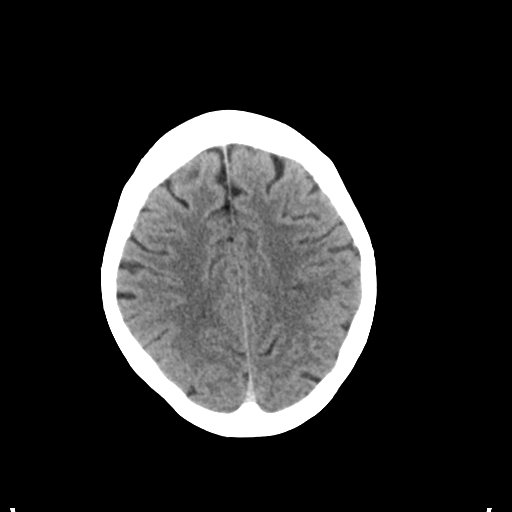
[im 20/27  brain]
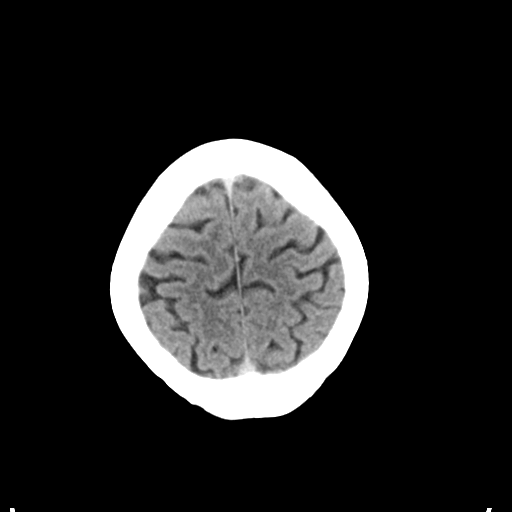
[im 22/27  brain]
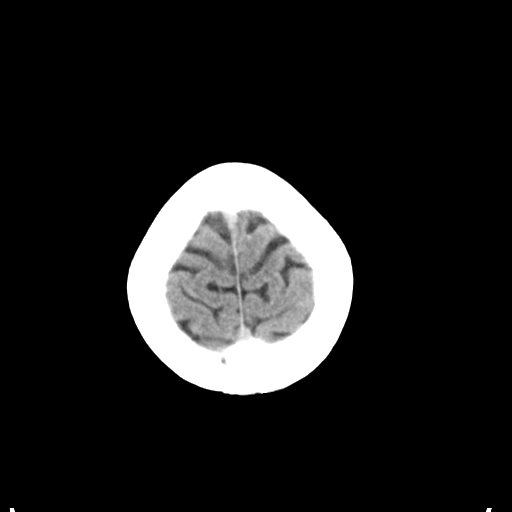
[im 25/27  brain]
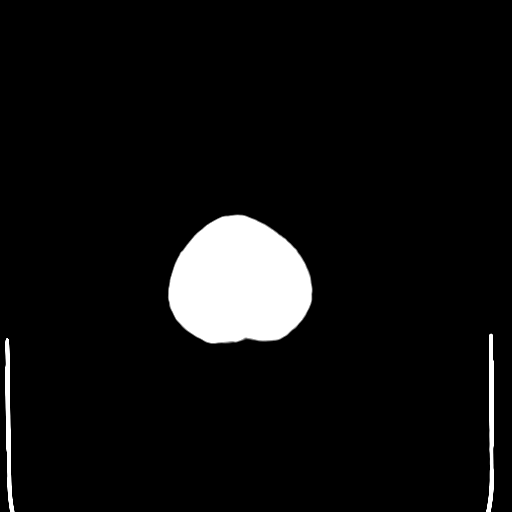
[im 25/27  bone]
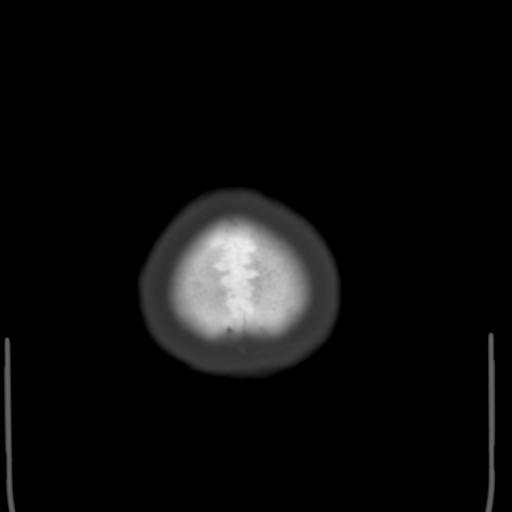

[Series 4: coronal soft tissue · coronal · 0.27mm/px · 3 of 61 slices shown]
[im 21/61  brain]
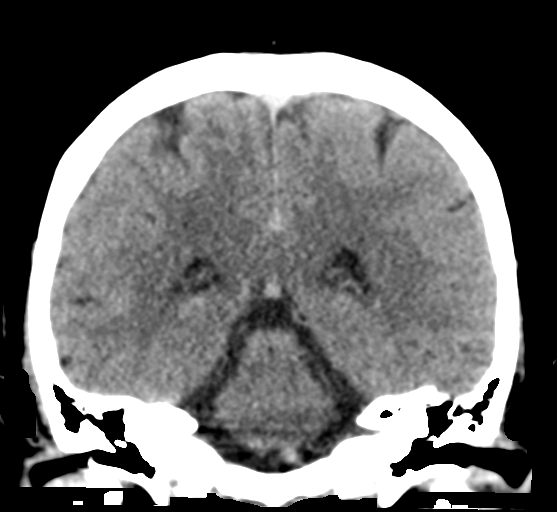
[im 27/61  brain]
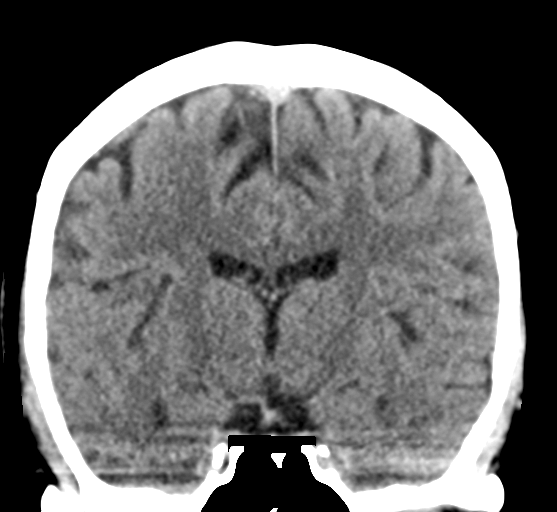
[im 34/61  brain]
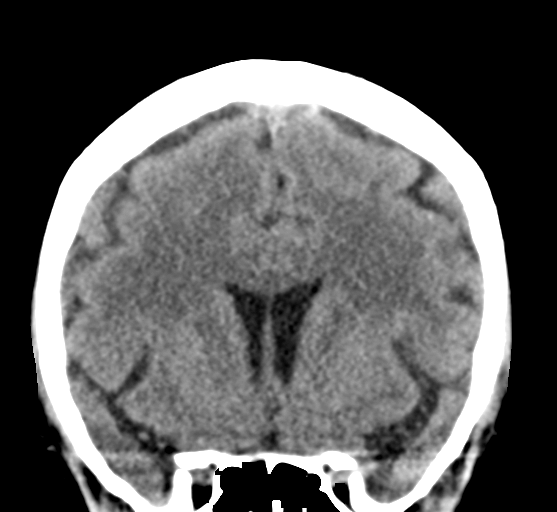

[Series 5: sagittal soft tissue · sagittal · 0.27mm/px · 3 of 51 slices shown]
[im 17/51  brain]
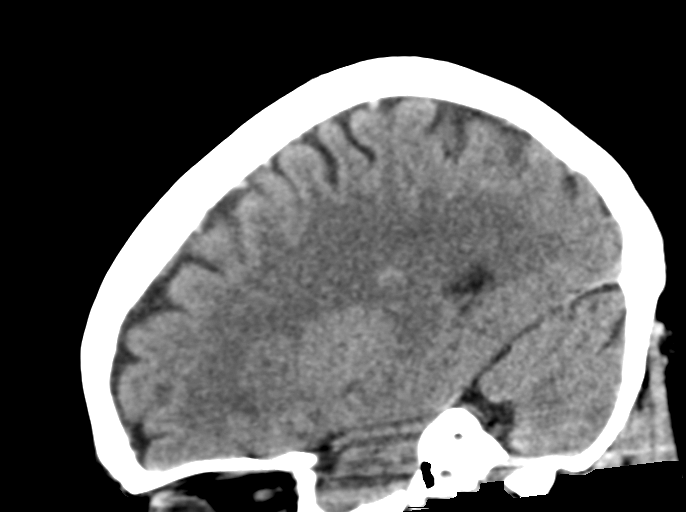
[im 26/51  brain]
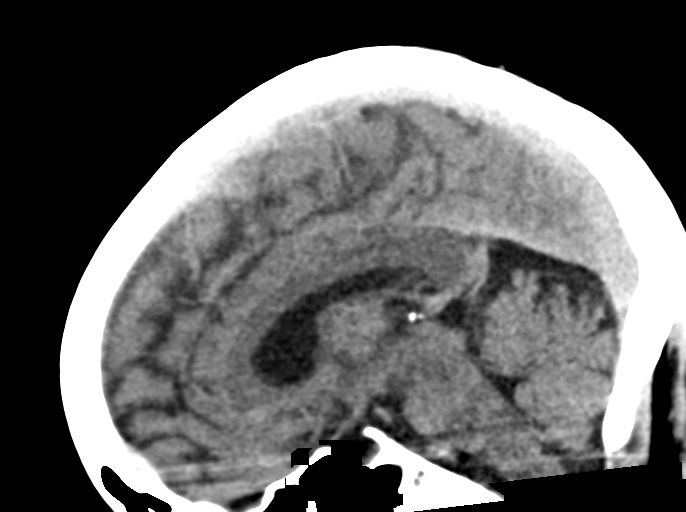
[im 34/51  brain]
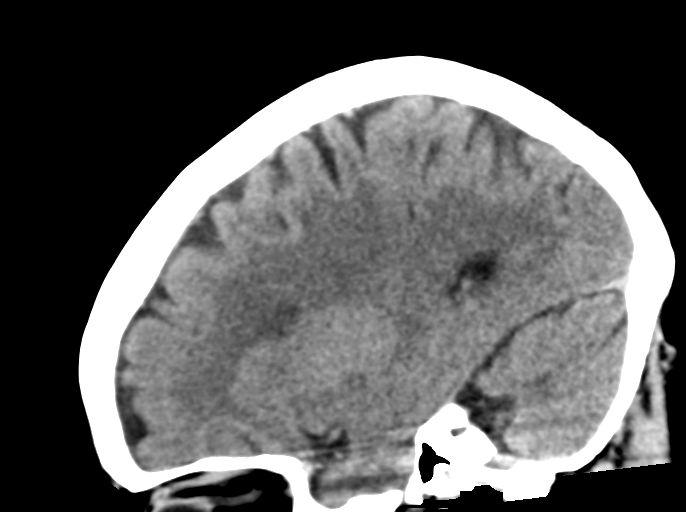

[15 of 44 positions shown; findings below may reference images not displayed]

FINDINGS: Brain: Hypodensity left frontal white matter compatible with infarct
of indeterminate age. Possible recent infarct.

Right frontal periventricular white matter most consistent with
chronic ischemia. Negative for hemorrhage or mass. Ventricle size
normal without midline shift.

Vascular: Negative for hyperdense vessel

Skull: Negative

Sinuses/Orbits: Negative

Other: None

ASPECTS (Alberta Stroke Program Early CT Score)

- Ganglionic level infarction (caudate, lentiform nuclei, internal
capsule, insula, M1-M3 cortex): 7

- Supraganglionic infarction (M4-M6 cortex): 3

Total score (0-10 with 10 being normal): 10
IMPRESSION: 1. Hypodensity left frontal white matter compatible with ischemia of
indeterminate age. Negative for hemorrhage
2. ASPECTS is 10
3. These results were called by telephone at the time of
interpretation on [DATE] at [DATE] to Dr. ISMIAJI , who
verbally acknowledged these results.

## 2018-04-18 IMAGING — MR MRA HEAD WITHOUT CONTRAST
10 of 13 series · 31 of 48 positions shown · non-contrast
Comparison: Head CT same day

CLINICAL DATA: Left-sided numbness and right-sided facial droop
over the last few days. Weakness and dizziness.

EXAM:
MRI HEAD WITHOUT CONTRAST
MRA HEAD WITHOUT CONTRAST
TECHNIQUE: Multiplanar, multiecho pulse sequences of the brain and surrounding
structures were obtained without intravenous contrast. Angiographic
images of the head were obtained using MRA technique without
contrast.

[Series 5: ax dwi_tracew · axial · 3.0mm · 0.60mm/px · z∈[-71,+91]mm · 4 of 55 slices shown]
[im 1/55]
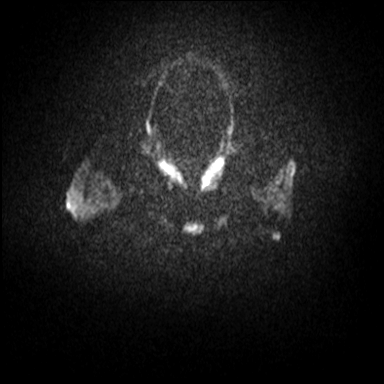
[im 19/55]
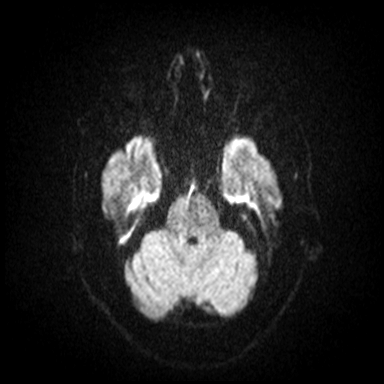
[im 37/55]
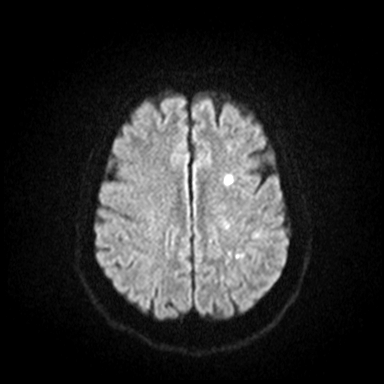
[im 55/55]
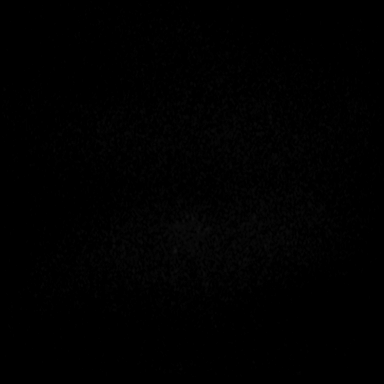

[Series 6: ax dwi_adc · axial · 3.0mm · 0.60mm/px · z∈[-71,+88]mm · 3 of 54 slices shown]
[im 1/54]
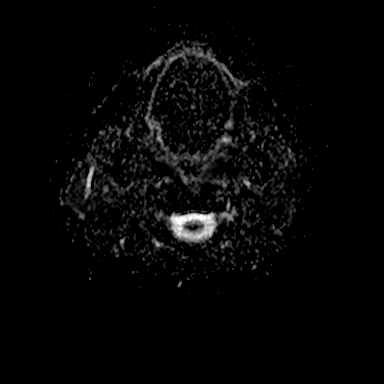
[im 27/54]
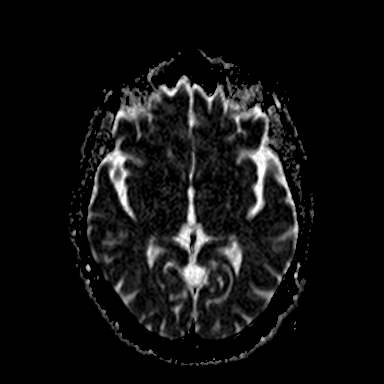
[im 54/54]
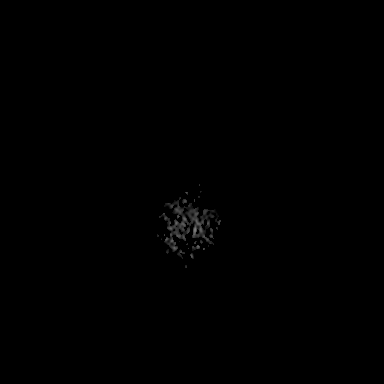

[Series 9: cor dwi_tracew · coronal · 5.0mm · 0.60mm/px · 2 of 45 slices shown]
[im 1/45]
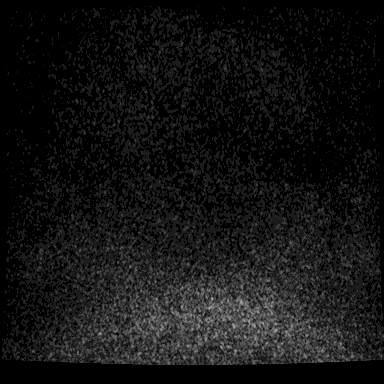
[im 45/45]
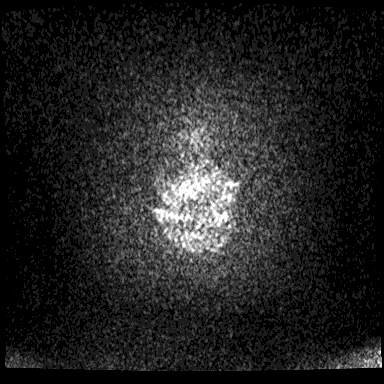

[Series 10: cor dwi_adc · coronal · 5.0mm · 0.60mm/px · 2 of 43 slices shown]
[im 1/43]
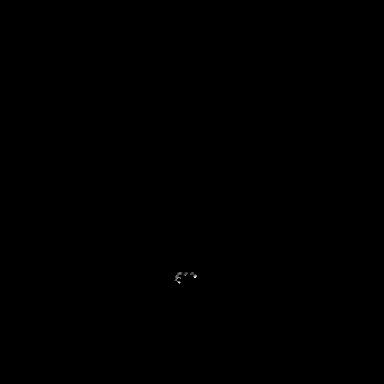
[im 43/43]
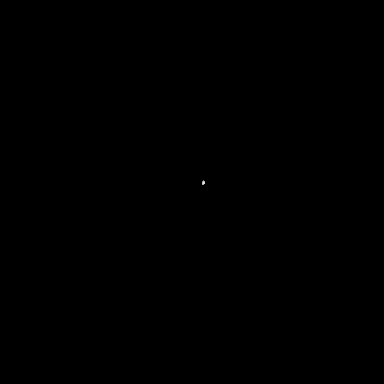

[Series 11: TOF · axial · 0.5mm · 0.41mm/px · z∈[-65,-5]mm · 5 of 205 slices shown]
[im 1/205]
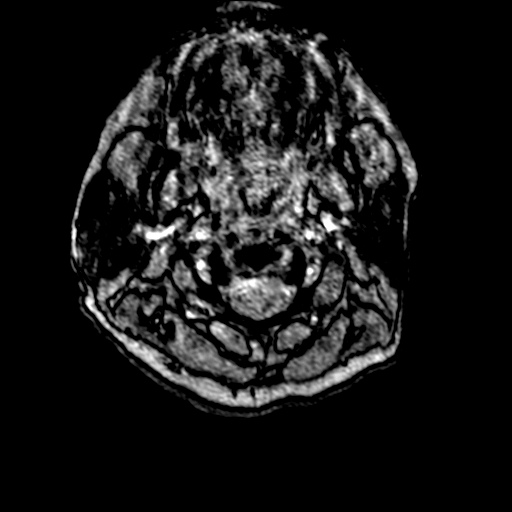
[im 41/205]
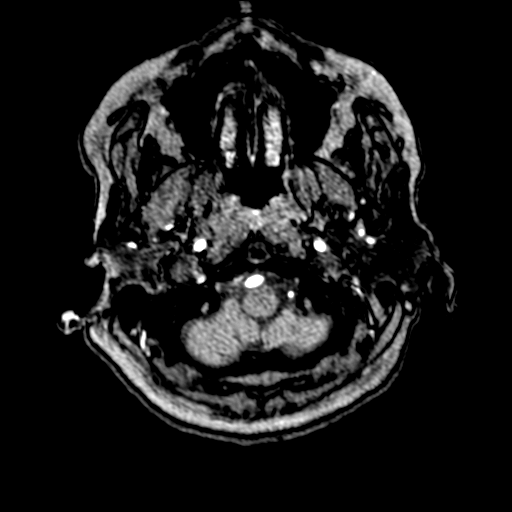
[im 62/205]
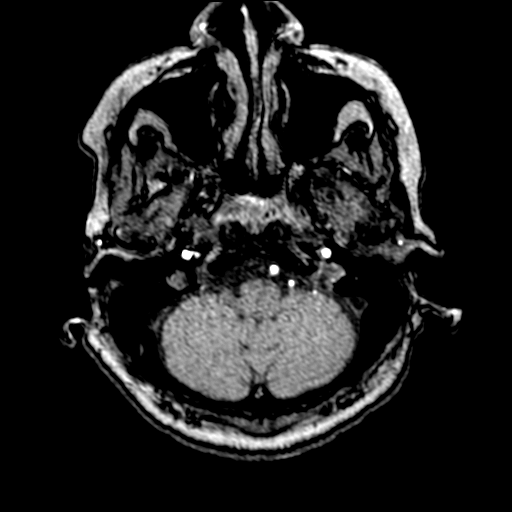
[im 82/205]
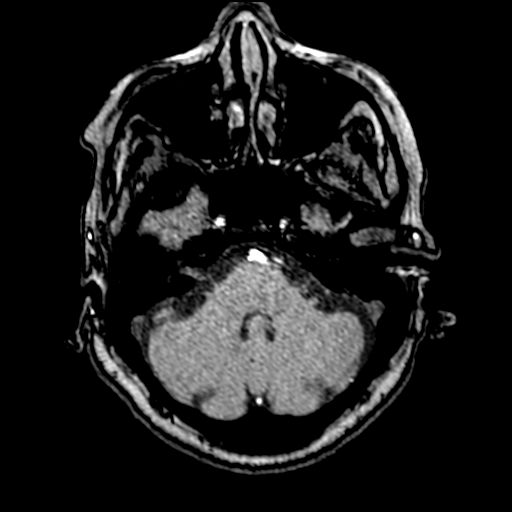
[im 123/205]
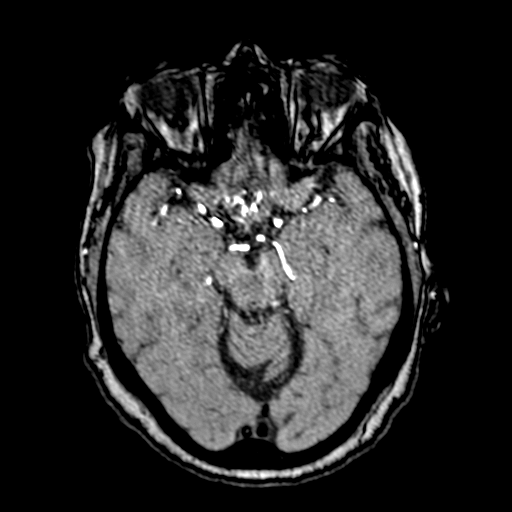

[Series 16: T1 · sagittal · 5.0mm · 0.62mm/px · 1 of 25 slices shown (1 of 2)]
[im 1/25]
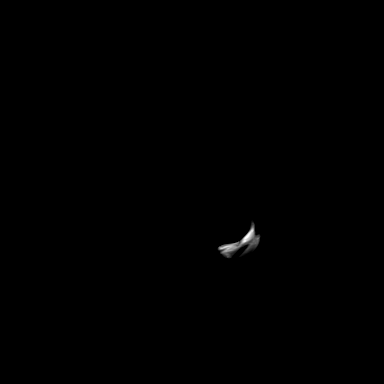

[Series 17: T2 · axial · 5.0mm · 0.53mm/px · 1 of 25 slices shown (1 of 2)]
[im 1/25]
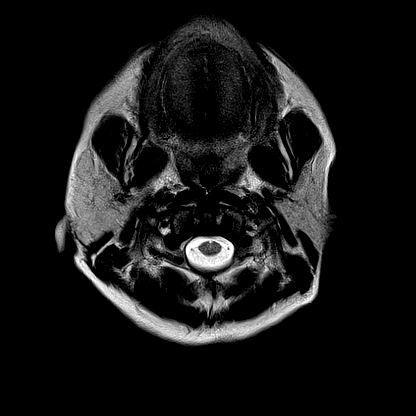

[Series 22: FLAIR · axial · 3.0mm · 0.53mm/px · z∈[-72,+90]mm · 3 of 55 slices shown]
[im 1/55]
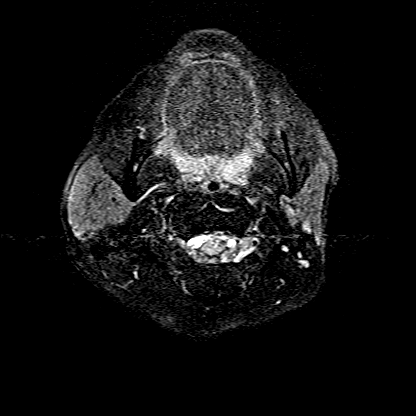
[im 28/55]
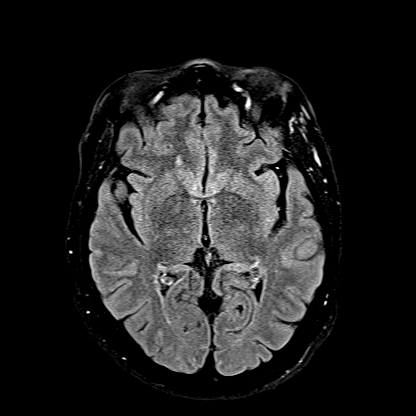
[im 55/55]
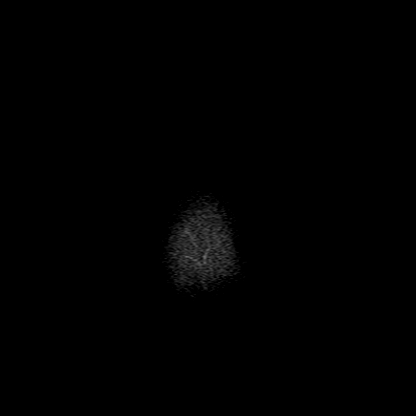

[Series 23: T1 · axial · 1.0mm · 0.98mm/px · z∈[-83,+92]mm · 8 of 176 slices shown (2 of 2)]
[im 1/176]
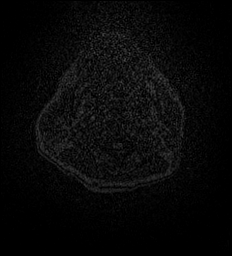
[im 20/176]
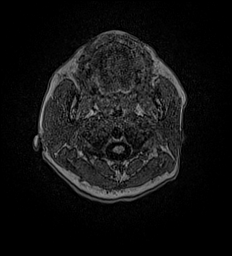
[im 59/176]
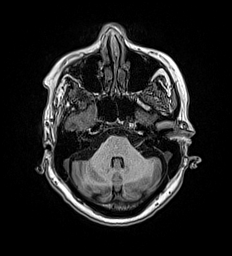
[im 78/176]
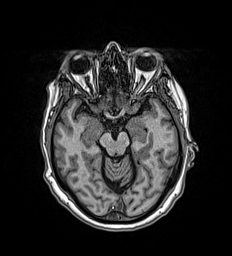
[im 98/176]
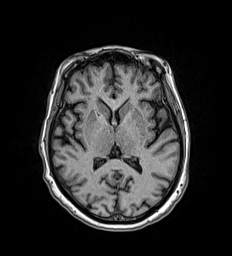
[im 117/176]
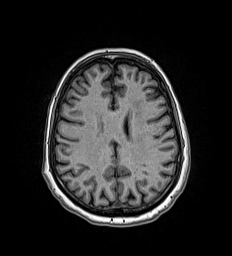
[im 156/176]
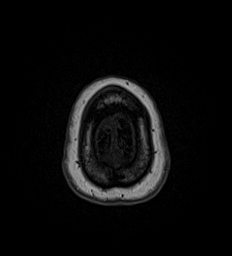
[im 176/176]
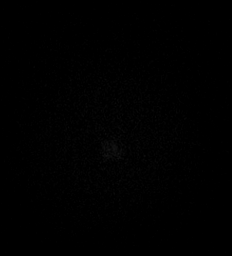

[Series 24: T2 · coronal · 5.0mm · 0.57mm/px · 2 of 29 slices shown (2 of 2)]
[im 1/29]
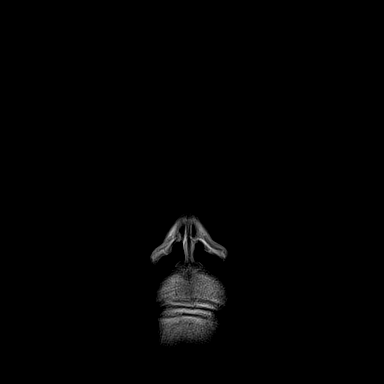
[im 29/29]
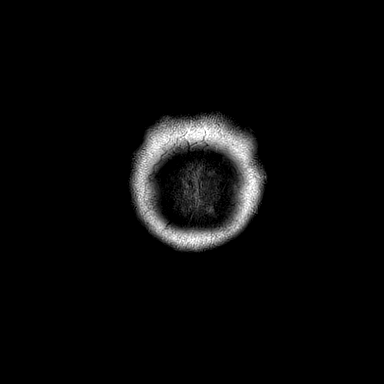

[31 of 48 positions shown; findings below may reference images not displayed]

FINDINGS: MRI HEAD FINDINGS

Brain: Diffusion imaging shows numerous acute watershed infarctions
affecting the white matter of the left hemisphere from the frontal
through parietal regions. Few tiny foci punctate cortical
infarction. No large confluent infarction. Elsewhere, there is
chronic small vessel ischemic change in the right para median pons.
No focal cerebellar insult. Cerebral hemispheres elsewhere show mild
chronic small-vessel ischemic change of the white matter. No mass
lesion, hemorrhage, hydrocephalus or extra-axial collection.

Vascular: Major vessels at the base of the brain show flow.

Skull and upper cervical spine: Negative

Sinuses/Orbits: Clear/normal

Other: None

MRA HEAD FINDINGS

Both internal carotid arteries are patent through the skull base and
siphon regions. No siphon stenosis. On the right, the anterior and
middle cerebral vessels are patent without proximal stenosis,
aneurysm or vascular malformation. More distal branch vessels show
some atherosclerotic irregularity. On the left, the anterior
cerebral artery shows flow with a moderate stenosis at the A1
segment. The left M1 segment is patent. There is occlusion of the
superior division M2 branch consistent with embolic occlusion.

Both vertebral arteries are patent to the basilar. The right is
dominant. No basilar stenosis. Posterior circulation branch vessels
are patent. There are multiple stenoses throughout the PCA
territories.
IMPRESSION: Watershed distribution infarction affecting primarily the deep white
matter in the left frontal and parietal regions in a patchy fashion.
Small foci of cortical infarction. No evidence of hemorrhage or
swelling. Occlusion of the left M2 superior division consistent with
embolic occlusion.

Mild chronic small-vessel ischemic changes elsewhere of the cerebral
hemispheric white matter.

Additional distal vessel atherosclerotic irregularity throughout the
intracranial branch vessels, particularly notable in the PCA
branches.

These results were called by telephone at the time of interpretation
on [DATE] at [DATE] to Dr. APFRIANI , who verbally acknowledged
these results.

## 2018-04-18 MED ORDER — SODIUM CHLORIDE 0.9% FLUSH
3.0000 mL | Freq: Once | INTRAVENOUS | Status: AC
  Administered 2018-04-18: 17:00:00 3 mL via INTRAVENOUS

## 2018-04-18 MED ORDER — ENOXAPARIN SODIUM 40 MG/0.4ML ~~LOC~~ SOLN
40.0000 mg | SUBCUTANEOUS | Status: DC
  Administered 2018-04-18: 40 mg via SUBCUTANEOUS
  Filled 2018-04-18: qty 0.4

## 2018-04-18 MED ORDER — STROKE: EARLY STAGES OF RECOVERY BOOK
Freq: Once | Status: AC
  Administered 2018-04-18: 17:00:00

## 2018-04-18 MED ORDER — ACETAMINOPHEN 160 MG/5ML PO SOLN
650.0000 mg | ORAL | Status: DC | PRN
  Filled 2018-04-18: qty 20.3

## 2018-04-18 MED ORDER — ACETAMINOPHEN 325 MG PO TABS: 650.0000 mg | ORAL_TABLET | ORAL | Status: DC | PRN

## 2018-04-18 MED ORDER — NICOTINE 21 MG/24HR TD PT24
21.0000 mg | MEDICATED_PATCH | Freq: Every day | TRANSDERMAL | Status: DC
  Filled 2018-04-18: qty 1

## 2018-04-18 MED ORDER — ASPIRIN 325 MG PO TABS
325.0000 mg | ORAL_TABLET | Freq: Every day | ORAL | Status: DC
  Administered 2018-04-18: 17:00:00 325 mg via ORAL
  Filled 2018-04-18 (×2): qty 1

## 2018-04-18 MED ORDER — ASPIRIN 300 MG RE SUPP: 300.0000 mg | Freq: Every day | RECTAL | Status: DC

## 2018-04-18 MED ORDER — ACETAMINOPHEN 650 MG RE SUPP: 650.0000 mg | RECTAL | Status: DC | PRN

## 2018-04-18 MED ORDER — ALTEPLASE 100 MG IV SOLR
INTRAVENOUS | Status: AC
  Filled 2018-04-18: qty 100

## 2018-04-18 NOTE — Consult Note (Addendum)
Referring Physician: Arnaldo Natal, MD    Chief Complaint: Left side numbness, right facial droop, slurred speech  HPI: Brandi Rojas is an 63 y.o. female with no known past medical history presenting to the ED with c/o left sided numbness, right facial droop, and new worsening weakness and dizziness today at around 1040 am. She reports onset of symptoms since 04/16/2018 and has progressively worsened. Denies associated altered sensorium, cranial nerve deficit, seizures like activity, focal motor deficits, diplopia, nausea or vomiting, syncope or LOC, or other associated focal neurological deficit. She however endorses feeling of lightheadedness and dizziness with clumsiness as if she is swaying to the sides. On arrival to the ED, she was afebrile with blood pressure 182/90 mm Hg and pulse rate 67 beats/min. She was slurring her speech and noted to have right facial droop; she was alert and oriented x4, and did not demonstrate any memory deficits. Blood glucose was 269. Initial NIHSS 4. A non contrast CT head showed hypodensity left frontal white matter compatible with ischemia of indeterminate age. Otherwise negative for hemorrhage. Patient was deemed not a candidate for tPA thrombolytics because of being outside window period. She will be admitted for further stroke work up and management.  Date last known well: Date: 04/16/2018 Time last known well: Unable to determine tPA Given: No: Outside time window  History reviewed. No pertinent past medical history.  History reviewed. No pertinent surgical history.  History reviewed. No pertinent family history.   Social History:  reports that she has been smoking. She has been smoking about 0.50 packs per day. She has never used smokeless tobacco. She reports current alcohol use of about 2 40-oz beer daily. She reports that she does not use illicit drugs.  Allergies: No Known Allergies  Medications:  I have reviewed the patient's current  medications. Prior to Admission:   Scheduled: . sodium chloride flush  3 mL Intravenous Once   Prior to Admission medications   Medication Sig Start Date End Date Taking? Authorizing Provider  naproxen (NAPROSYN) 500 MG tablet Take 1 tablet (500 mg total) by mouth 2 (two) times daily. 05/14/15   Barrett, Rolm Gala, PA-C    ROS: History obtained from the patient   General ROS: negative for - chills, fatigue, fever, night sweats, weight gain or weight loss Psychological ROS: negative for - behavioral disorder, hallucinations, memory difficulties, mood swings or suicidal ideation Ophthalmic ROS: negative for - blurry vision, double vision, eye pain or loss of vision ENT ROS: negative for - epistaxis, nasal discharge, oral lesions, sore throat, tinnitus or vertigo Allergy and Immunology ROS: negative for - hives or itchy/watery eyes Hematological and Lymphatic ROS: negative for - bleeding problems, bruising or swollen lymph nodes Endocrine ROS: negative for - galactorrhea, hair pattern changes, polydipsia/polyuria or temperature intolerance Respiratory ROS: negative for - cough, hemoptysis, shortness of breath or wheezing Cardiovascular ROS: negative for - chest pain, dyspnea on exertion, edema or irregular heartbeat Gastrointestinal ROS: negative for - abdominal pain, diarrhea, hematemesis, nausea/vomiting or stool incontinence Genito-Urinary ROS: negative for - dysuria, hematuria, incontinence or urinary frequency/urgency Musculoskeletal ROS: negative for - joint swelling or muscular weakness Neurological ROS: as noted in HPI Dermatological ROS: negative for rash and skin lesion changes  Physical Examination: Blood pressure (!) 182/90, pulse 67, resp. rate 15, height 5\' 5"  (1.651 m), weight 72.3 kg, SpO2 99 %.   HEENT-  Normocephalic, no lesions, without obvious abnormality.  Normal external eye and conjunctiva.  Normal TM's bilaterally.  Normal auditory  canals and external ears. Normal  external nose, mucus membranes and septum.  Normal pharynx. Cardiovascular- S1, S2 normal, pulses palpable throughout   Lungs- chest clear, no wheezing, rales, normal symmetric air entry Abdomen- soft, non-tender; bowel sounds normal; no masses,  no organomegaly Extremities- no edema Lymph-no adenopathy palpable Musculoskeletal-no joint tenderness, deformity or swelling Skin-warm and dry, no hyperpigmentation, vitiligo, or suspicious lesions  Neurological Exam   Mental Status: Alert, oriented, thought content appropriate.  Speech dysarthric without evidence of aphasia.  Able to follow 3 step commands without difficulty. Attention span and concentration seemed appropriate  Cranial Nerves: II: Discs flat bilaterally; Visual fields grossly normal, pupils equal, round, reactive to light and accommodation III,IV, VI: ptosis not present, extra-ocular motions intact bilaterally V,VII: smile asymmetric, right facial droop,  facial light touch sensation decreased on the left VIII: hearing normal bilaterally IX,X: gag reflex present XI: bilateral shoulder shrug XII: midline tongue extension Motor: Right :  Upper extremity   5/5 Without pronator drift      Left: Upper extremity   5/5 without pronator drift Right:   Lower extremity   5/5                                          Left: Lower extremity   5/5 Tone and bulk:normal tone throughout; no atrophy noted Sensory: Pinprick and light touch decreased on the left Deep Tendon Reflexes: 2+ and symmetric throughout Plantars: Right: mute                              Left: mute Cerebellar: Finger-to-nose testing intact bilaterally. Heel to shin testing normal bilaterally Gait: not tested due to safety concerns  Data Reviewed  Laboratory Studies:  Basic Metabolic Panel: No results for input(s): NA, K, CL, CO2, GLUCOSE, BUN, CREATININE, CALCIUM, MG, PHOS in the last 168 hours.  Liver Function Tests: No results for input(s): AST, ALT, ALKPHOS,  BILITOT, PROT, ALBUMIN in the last 168 hours. No results for input(s): LIPASE, AMYLASE in the last 168 hours. No results for input(s): AMMONIA in the last 168 hours.  CBC: Recent Labs  Lab 04/18/18 1115  WBC 8.3  NEUTROABS 4.5  HGB 15.7*  HCT 48.6*  MCV 83.9  PLT 273    Cardiac Enzymes: No results for input(s): CKTOTAL, CKMB, CKMBINDEX, TROPONINI in the last 168 hours.  BNP: Invalid input(s): POCBNP  CBG: Recent Labs  Lab 04/18/18 1059  GLUCAP 269*    Microbiology: Results for orders placed or performed during the hospital encounter of 05/14/15  Body fluid culture     Status: None   Collection Time: 05/14/15  1:48 PM  Result Value Ref Range Status   Specimen Description SYNOVIAL LEFT ANKLE  Final   Special Requests Normal  Final   Gram Stain   Final    MODERATE WBC PRESENT, PREDOMINANTLY PMN NO ORGANISMS SEEN    Culture NO GROWTH 3 DAYS  Final   Report Status 05/18/2015 FINAL  Final    Coagulation Studies: No results for input(s): LABPROT, INR in the last 72 hours.  Urinalysis: No results for input(s): COLORURINE, LABSPEC, PHURINE, GLUCOSEU, HGBUR, BILIRUBINUR, KETONESUR, PROTEINUR, UROBILINOGEN, NITRITE, LEUKOCYTESUR in the last 168 hours.  Invalid input(s): APPERANCEUR  Lipid Panel: No results found for: CHOL, TRIG, HDL, CHOLHDL, VLDL, LDLCALC  HgbA1C: No results found for: HGBA1C  Urine Drug Screen:  No results found for: LABOPIA, COCAINSCRNUR, LABBENZ, AMPHETMU, THCU, LABBARB  Alcohol Level: No results for input(s): ETH in the last 168 hours.  Other results: EKG: normal EKG, normal sinus rhythm, unchanged from previous tracings.  Imaging: Ct Head Code Stroke Wo Contrast  Result Date: 04/18/2018 CLINICAL DATA:  Code stroke.  Right facial droop EXAM: CT HEAD WITHOUT CONTRAST TECHNIQUE: Contiguous axial images were obtained from the base of the skull through the vertex without intravenous contrast. COMPARISON:  None. FINDINGS: Brain: Hypodensity left  frontal white matter compatible with infarct of indeterminate age. Possible recent infarct. Right frontal periventricular white matter most consistent with chronic ischemia. Negative for hemorrhage or mass. Ventricle size normal without midline shift. Vascular: Negative for hyperdense vessel Skull: Negative Sinuses/Orbits: Negative Other: None ASPECTS ([place] Stroke Program Early CT Score) - Ganglionic level infarction (caudate, lentiform nuclei, internal capsule, insula, M1-M3 cortex): 7 - Supraganglionic infarction (M4-M6 cortex): 3 Total score (0-10 with 10 being normal): 10 IMPRESSION: 1. Hypodensity left frontal white matter compatible with ischemia of indeterminate age. Negative for hemorrhage 2. ASPECTS is 10 3. These results were called by telephone at the time of interpretation on 04/18/2018 at 11:22 am to Dr. Dorothea Glassman , who verbally acknowledged these results. Electronically Signed   By: Marlan Palau M.D.   On: 04/18/2018 11:23   Patient seen and examined.  Clinical course and management discussed.  Necessary edits performed.  I agree with the above.  Assessment and plan of care developed and discussed below.    Assessment: 63 y.o. female with no known past medical history presenting to the ED with c/o left sided numbness, right facial droop, and new worsening weakness and dizziness today at around 1040 am. Concerns for ischemic event. CT head reviewed and shows hypodensity left frontal white matter compatible with ischemia of indeterminate age. Otherwise negative for hemorrhage. This is not consistent with the patient's symptoms.  Patient reports that she was not on any anticoagulation or antiplatelet therapy prior to this event. Even though no known past medical history, she may have underlying undiagnosed hypertension and hyperglycemia given elevated BP of 182/90 and blood glucose 269 on presentation.  Further work up recommended.  Stroke Risk Factors - smoking  Plan: 1. HgbA1c, fasting  lipid panel, UDS 2. MRI, MRA  of the brain without contrast 3. PT consult, OT consult, Speech consult 4. Echocardiogram 5. Carotid dopplers 6. Prophylactic therapy-Antiplatelet med: Aspirin - dose 325 mg/day 7. NPO until RN stroke swallow screen 8. Telemetry monitoring 9. Frequent neuro checks 10. CIWA protocol 11. BP control with target SBP<140 12. Smoking cessation counseling   This patient was staffed with Dr. Verlon Au, Thad Ranger who personally evaluated patient, reviewed documentation and agreed with assessment and plan of care as above.  Webb Silversmith, DNP, FNP-BC Board certified Nurse Practitioner Neurology Department   04/18/2018, 11:35 AM  Thana Farr, MD Neurology [phone]  04/18/2018  1:02 PM

## 2018-04-18 NOTE — ED Notes (Signed)
Daughter, Milbert Coulter [phone] left number.

## 2018-04-18 NOTE — ED Notes (Signed)
Patient transported to MRI 

## 2018-04-18 NOTE — Progress Notes (Signed)
CODE STROKE- PHARMACY COMMUNICATION   Time CODE STROKE called/page received:10:58  Time response to CODE STROKE was made (in person or via phone): 11:05  Time Stroke Kit retrieved from [facility] (only if needed):11:07  Name of Provider/Nurse contacted:Stephen RN, Dr. Doy Mince   History reviewed. No pertinent past medical history. Prior to Admission medications   Medication Sig Start Date End Date Taking? Authorizing Provider  naproxen (NAPROSYN) 500 MG tablet Take 1 tablet (500 mg total) by mouth 2 (two) times daily. 05/14/15   Barrett, Lahoma Crocker, PA-C   Dr. Doy Mince determined that patient was not appropriate for tPA, tPA was not mixed, returned tPA to [facility].  Paulina Fusi, PharmD, BCPS 04/18/2018 11:57 AM

## 2018-04-18 NOTE — Plan of Care (Signed)
Pt admitted today from the ED.  VSS.  NIH 3. Denies pain.

## 2018-04-18 NOTE — Progress Notes (Signed)
*  PRELIMINARY RESULTS* Echocardiogram 2D Echocardiogram has been performed.  Brandi Rojas 04/18/2018, 4:41 PM

## 2018-04-18 NOTE — ED Notes (Signed)
Pt arrived to room from CT.  Neurology at bedside with patient.

## 2018-04-18 NOTE — Evaluation (Signed)
Physical Therapy Evaluation Patient Details Name: Brandi Rojas MRN: [id number] DOB: August 28, 1955 Today's Date: 04/18/2018   History of Present Illness  63 y.o. female no significant past medical history presented to the emergency room with right facial droop and left arm and left leg numbness and weakness, slurred speech, which started few days ago no evidence of any seizure-like activity. Patient felt lightheaded and dizzy. She was worked up with CT head which showed hypodensities in the left frontal white matter of indeterminate age.  No evidence of hemorrage.    Clinical Impression  Patient in bed alert and oriented to self and situation, mildly impulsive with mobility during session. Pt reported that she lives with her daughter in a two story house, previously independent with ADLs/IADLs/gait.  Upon assessment patient reported impaired light touch sensation to L side of face, as well LUE and LLE. Mild deficits in strength noted as well (see PT extremity/trunk assessment for details). Mild deficits noted in coordination and thought processing noted as well. Patient demonstrated bed mobility with supervision, transfers with CGA and ambulated ~185ft with CGA. Pt with mild staggering of gait noted, and intermittent ambulation with narrow base of support, no LOB noted. Some higher level balance activities attempted, CGA to maintain safety. HR monitored throughout, telemetry monitory showed v-tach, RN notified and monitored upon return to room. Pt initially denying symptoms, but when repeatedly asked indicated she may have been lightheaded.  Pt in chair, HR WFLs at end of session. Overall the patient demonstrated deficits (see "PT Problem List") that impede the patient's functional abilities, safety, and mobility and would benefit from skilled PT intervention. Recommendation is HHPT with supervision for mobility/OOB due to impulsivity and safety concerns.      Follow Up Recommendations Home health  PT;Supervision for mobility/OOB    Equipment Recommendations  None recommended by PT    Recommendations for Other Services       Precautions / Restrictions Precautions Precautions: Fall Precaution Comments: HR  Restrictions Weight Bearing Restrictions: No      Mobility  Bed Mobility Overal bed mobility: Needs Assistance Bed Mobility: Supine to Sit;Sit to Supine     Supine to sit: Supervision Sit to supine: Supervision      Transfers Overall transfer level: Needs assistance Equipment used: None Transfers: Sit to/from Stand Sit to Stand: Min guard            Ambulation/Gait Ambulation/Gait assistance: Min guard Gait Distance (Feet): 125 Feet Assistive device: None   Gait velocity: decreased   General Gait Details: Some staggering noted, intermittent narrow base of support during ambulation. Able to stop and maintain balance without assistance. Extended time needed to follow commands. Telemetry box showed v-tach during ambulation, RN aware and monitoring on return to room. Patient initially denied dizziness/light headedness, but with probing questioning may have been experiencing symptoms.  Stairs            Wheelchair Mobility    Modified Rankin (Stroke Patients Only)       Balance Overall balance assessment: Needs assistance   Sitting balance-Leahy Scale: Normal     Standing balance support: No upper extremity supported Standing balance-Leahy Scale: Good                               Pertinent Vitals/Pain Pain Assessment: No/denies pain    Home Living Family/patient expects to be discharged to:: Private residence Living Arrangements: Children Available Help at Discharge: Family Type of  Home: House       Home Layout: Two level;1/2 bath on main level Home Equipment: None      Prior Function Level of Independence: Independent               Hand Dominance   Dominant Hand: Right    Extremity/Trunk Assessment    Upper Extremity Assessment Upper Extremity Assessment: RUE deficits/detail;LUE deficits/detail RUE Deficits / Details: pronator drifted noted on RUE. grossly 4+/5, grip 4/5 RUE Sensation: WNL RUE Coordination: WNL LUE Deficits / Details: grossly 4+/5, grip 4/5 LUE Sensation: decreased light touch LUE Coordination: decreased fine motor;decreased gross motor    Lower Extremity Assessment Lower Extremity Assessment: RLE deficits/detail;LLE deficits/detail RLE Deficits / Details: hip flexion 4-/5, remaining 4+/5 LLE Deficits / Details: hip flexion 4/5, knee flexion 4/5, remaining 4+/5 LLE Sensation: decreased light touch LLE Coordination: decreased gross motor       Communication   Communication: No difficulties  Cognition Arousal/Alertness: Awake/alert Behavior During Therapy: WFL for tasks assessed/performed;Impulsive;Flat affect Overall Cognitive Status: Within Functional Limits for tasks assessed                                 General Comments: Patient mildly impulsive, eager to move. Vocalizes few words      General Comments      Exercises     Assessment/Plan    PT Assessment Patient needs continued PT services  PT Problem List Decreased coordination;Decreased strength;Decreased activity tolerance;Decreased balance;Decreased safety awareness;Decreased mobility;Decreased knowledge of precautions       PT Treatment Interventions DME instruction;Therapeutic exercise;Gait training;Balance training;Stair training;Neuromuscular re-education;Functional mobility training;Therapeutic activities;Patient/family education    PT Goals (Current goals can be found in the Care Plan section)  Acute Rehab PT Goals Patient Stated Goal: to get back to normal PT Goal Formulation: With patient Time For Goal Achievement: 05/02/18 Potential to Achieve Goals: Good    Frequency 7X/week   Barriers to discharge        Co-evaluation               AM-PAC PT "6  Clicks" Mobility  Outcome Measure Help needed turning from your back to your side while in a flat bed without using bedrails?: None Help needed moving from lying on your back to sitting on the side of a flat bed without using bedrails?: None Help needed moving to and from a bed to a chair (including a wheelchair)?: A Little Help needed standing up from a chair using your arms (e.g., wheelchair or bedside chair)?: A Little Help needed to walk in hospital room?: A Little Help needed climbing 3-5 steps with a railing? : A Little 6 Click Score: 20    End of Session Equipment Utilized During Treatment: Gait belt Activity Tolerance: Patient tolerated treatment well Patient left: in bed;with call bell/phone within reach;with bed alarm set Nurse Communication: Mobility status PT Visit Diagnosis: Unsteadiness on feet (R26.81);Other abnormalities of gait and mobility (R26.89);Difficulty in walking, not elsewhere classified (R26.2);Muscle weakness (generalized) (M62.81)    Time: 7703-4035 PT Time Calculation (min) (ACUTE ONLY): 32 min   Charges:   PT Evaluation $PT Eval Low Complexity: 1 Low PT Treatments $Therapeutic Activity: 8-22 mins        Olga Coaster PT, DPT 4:34 PM,04/18/18 [phone]

## 2018-04-18 NOTE — ED Notes (Signed)
Right sided facial droop that started at 1040 today.

## 2018-04-18 NOTE — ED Triage Notes (Signed)
Pt to ED from home brought in by daughter states left sided numbness for a couple days, right facial droop, and new worsening weakness and dizziness today around 1040.  Pt presents A&Ox4, slight slurring of words but understandable, denies pain.  Pt taken to CT, neurology met pt in CT and with pt upon arrival to ED room.

## 2018-04-18 NOTE — H&P (Signed)
[facility] [place] [facility] [employer] [facility] [employer] [facility]   PATIENT NAME: Brandi Rojas    MR#:  [id number]  DATE OF BIRTH:  [birth year]  DATE OF ADMISSION:  04/18/2018  PRIMARY CARE PHYSICIAN: No primary care provider on file.   REQUESTING/REFERRING PHYSICIAN:   CHIEF COMPLAINT:   Chief Complaint  Patient presents with  . Numbness  . Weakness  . Facial Droop    HISTORY OF PRESENT ILLNESS: Brandi Rojas  is a 63 y.o. female no significant past medical history presented to the emergency room with right facial droop and left arm and left leg numbness and weakness which started few days ago no evidence of any seizure-like activity.  Patient felt lightheaded and dizzy.  When she presented to emergency room her blood pressure was 182/90 mmHg. mild slurring of speech noted.  She was worked up with CT head which showed hypodensities in the left frontal white matter of indeterminate age.  No evidence of hemorrhage.  Neurology evaluated the patient and deemed not a candidate for TPA thrombolytics as she is out of the window period.  PAST MEDICAL HISTORY:   No history of CAD, hypertension, DM  PAST SURGICAL HISTORY:  Past Surgical History:  Procedure Laterality Date  . none      SOCIAL HISTORY:  Social History   Tobacco Use  . Smoking status: Current Every Day Smoker    Packs/day: 0.50  . Smokeless tobacco: Never Used  Substance Use Topics  . Alcohol use: Yes    Alcohol/week: 7.0 standard drinks    Types: 7 Cans of beer per week    Comment: 2 40oz a day    FAMILY HISTORY:  Family History  Problem Relation Age of Onset  . CVA Neg Hx   . Hypertension Neg Hx     DRUG ALLERGIES: No Known Allergies  REVIEW OF SYSTEMS:   CONSTITUTIONAL: No fever,has fatigue or weakness.  EYES: No blurred or double vision.  EARS, NOSE, AND THROAT: No tinnitus or ear pain.  RESPIRATORY: No cough, shortness of breath, wheezing or hemoptysis.  CARDIOVASCULAR: No chest pain, orthopnea, edema.   GASTROINTESTINAL: No nausea, vomiting, diarrhea or abdominal pain.  GENITOURINARY: No dysuria, hematuria.  ENDOCRINE: No polyuria, nocturia,  HEMATOLOGY: No anemia, easy bruising or bleeding SKIN: No rash or lesion. MUSCULOSKELETAL: No joint pain or arthritis.   NEUROLOGIC: No tingling, has numbness, weakness left arm and left leg.  PSYCHIATRY: No anxiety or depression.   MEDICATIONS AT HOME:  Prior to Admission medications   Medication Sig Start Date End Date Taking? Authorizing Provider  naproxen (NAPROSYN) 500 MG tablet Take 1 tablet (500 mg total) by mouth 2 (two) times daily. 05/14/15   Barrett, Rolm Gala, PA-C      PHYSICAL EXAMINATION:   VITAL SIGNS: Blood pressure (!) 182/90, pulse 67, resp. rate 15, height  (1.651 m), weight 72.3 kg, SpO2 99 %.  GENERAL:  63 y.o.-year-old patient lying in the bed with no acute distress.  EYES: Pupils equal, round, reactive to light and accommodation. No scleral icterus. Extraocular muscles intact.  HEENT: Head atraumatic, normocephalic. Oropharynx and nasopharynx clear.  NECK:  Supple, no jugular venous distention. No thyroid enlargement, no tenderness.  LUNGS: Normal breath sounds bilaterally, no wheezing, rales,rhonchi or crepitation. No use of accessory muscles of respiration.  CARDIOVASCULAR: S1, S2 normal. No murmurs, rubs, or gallops.  ABDOMEN: Soft, nontender, nondistended. Bowel sounds present. No organomegaly or mass.  EXTREMITIES: No pedal edema, cyanosis, or clubbing.  NEUROLOGIC: Right facial  droop. Muscle strength 4/5 in left upper and lower extremity.Right upper and lower extremity power 5/5 Sensation intact. Gait not checked.  PSYCHIATRIC: The patient is alert and oriented x 3.  SKIN: No obvious rash, lesion, or ulcer.   LABORATORY PANEL:   CBC Recent Labs  Lab 04/18/18 1115  WBC 8.3  HGB 15.7*  HCT 48.6*  PLT 273  MCV 83.9  MCH 27.1  MCHC 32.3  RDW 12.9  LYMPHSABS 3.2  MONOABS 0.4  EOSABS 0.1  BASOSABS 0.0    ------------------------------------------------------------------------------------------------------------------  Chemistries  Recent Labs  Lab 04/18/18 1115  NA 138  K 4.4  CL 100  CO2 24  GLUCOSE 273*  BUN 12  CREATININE 0.69  CALCIUM 9.1  AST 26  ALT 19  ALKPHOS 83  BILITOT 1.2   ------------------------------------------------------------------------------------------------------------------ estimated creatinine clearance is 72.6 mL/min (by C-G formula based on SCr of 0.69 mg/dL). ------------------------------------------------------------------------------------------------------------------ No results for input(s): TSH, T4TOTAL, T3FREE, THYROIDAB in the last 72 hours.  Invalid input(s): FREET3   Coagulation profile Recent Labs  Lab 04/18/18 1205  INR 0.9   ------------------------------------------------------------------------------------------------------------------- No results for input(s): DDIMER in the last 72 hours. -------------------------------------------------------------------------------------------------------------------  Cardiac Enzymes Recent Labs  Lab 04/18/18 1115  TROPONINI <0.03   ------------------------------------------------------------------------------------------------------------------ Invalid input(s): POCBNP  ---------------------------------------------------------------------------------------------------------------  Urinalysis No results found for: COLORURINE, APPEARANCEUR, LABSPEC, PHURINE, GLUCOSEU, HGBUR, BILIRUBINUR, KETONESUR, PROTEINUR, UROBILINOGEN, NITRITE, LEUKOCYTESUR   RADIOLOGY: Ct Head Code Stroke Wo Contrast  Result Date: 04/18/2018 CLINICAL DATA:  Code stroke.  Right facial droop EXAM: CT HEAD WITHOUT CONTRAST TECHNIQUE: Contiguous axial images were obtained from the base of the skull through the vertex without intravenous contrast. COMPARISON:  None. FINDINGS: Brain: Hypodensity left frontal white  matter compatible with infarct of indeterminate age. Possible recent infarct. Right frontal periventricular white matter most consistent with chronic ischemia. Negative for hemorrhage or mass. Ventricle size normal without midline shift. Vascular: Negative for hyperdense vessel Skull: Negative Sinuses/Orbits: Negative Other: None ASPECTS (Alberta Stroke Program Early CT Score) - Ganglionic level infarction (caudate, lentiform nuclei, internal capsule, insula, M1-M3 cortex): 7 - Supraganglionic infarction (M4-M6 cortex): 3 Total score (0-10 with 10 being normal): 10 IMPRESSION: 1. Hypodensity left frontal white matter compatible with ischemia of indeterminate age. Negative for hemorrhage 2. ASPECTS is 10 3. These results were called by telephone at the time of interpretation on 04/18/2018 at 11:22 am to Dr. Dorothea Glassman , who verbally acknowledged these results. Electronically Signed   By: Marlan Palau M.D.   On: 04/18/2018 11:23    EKG: Orders placed or performed during the hospital encounter of 04/18/18  . ED EKG  . ED EKG    IMPRESSION AND PLAN: 63 year old female patient no significant past medical history presented to the emergency room with right facial droop and left arm and left leg numbness and weakness which started few days ago.  -Left arm and leg weakness along with right facial droop Admit patient to inpatient service For work-up for CVA Check MRI MRA brain Neurology consult Carotid ultrasound and echocardiogram Start aspirin Physical therapy and speech therapy evaluation  -DVT prophylaxis subcu Lovenox daily  -Elevated blood pressure In view of CVA will allow permissive hypertension Monitor closely  -Tobacco abuse Tobacco cessation counseled to the patient for 6 minutes Nicotine patch offered  All the records are reviewed and case discussed with ED provider. Management plans discussed with the patient, family and they are in agreement.  CODE STATUS:Full  code    TOTAL TIME TAKING CARE OF THIS  PATIENT: 54 minutes.    Ihor Austin M.D on 04/18/2018 at 12:59 PM  Between 7am to 6pm - Pager - [phone]  After 6pm go to [email or web address] - password [email or web address] [facility]  [facility]  Office  [phone]  CC: Primary care physician; No primary care provider on file.

## 2018-04-18 NOTE — ED Notes (Signed)
CODE STROKE CALLED TO 333 

## 2018-04-18 NOTE — ED Provider Notes (Signed)
[facility] Emergency Department Provider Note   ____________________________________________   First MD Initiated Contact with Patient 04/18/18 1106     (approximate)  I have reviewed the triage vital signs and the nursing notes.   HISTORY  Chief Complaint Numbness; Weakness; and Facial Droop    HPI Brandi Rojas is a 63 y.o. female patient reports episodes of numbness and weakness on the left side with a right facial droop.  This is been going on intermittently for 2 days.  Happened again today about an hour prior to this note.         History reviewed. No pertinent past medical history. Past history inflammatory arthritis There are no active problems to display for this patient.   History reviewed. No pertinent surgical history.  Prior to Admission medications   Medication Sig Start Date End Date Taking? Authorizing Provider  naproxen (NAPROSYN) 500 MG tablet Take 1 tablet (500 mg total) by mouth 2 (two) times daily. 05/14/15   Barrett, Rolm Gala, PA-C    Allergies Patient has no known allergies.  History reviewed. No pertinent family history.  Social History Social History   Tobacco Use   Smoking status: Current Every Day Smoker    Packs/day: 0.50   Smokeless tobacco: Never Used  Substance Use Topics   Alcohol use: Yes    Alcohol/week: 7.0 standard drinks    Types: 7 Cans of beer per week    Comment: 2 40oz a day   Drug use: No    Review of Systems  Constitutional: No fever/chills Eyes: No visual changes. ENT: No sore throat. Cardiovascular: Denies chest pain. Respiratory: Denies shortness of breath. Gastrointestinal: No abdominal pain.  No nausea, no vomiting.  No diarrhea.  No constipation. Genitourinary: Negative for dysuria. Musculoskeletal: Negative for back pain. Skin: Negative for rash. Neurological: Negative for headaches  ____________________________________________   PHYSICAL EXAM:  VITAL SIGNS: ED  Triage Vitals  Enc Vitals Group     BP 04/18/18 1115 (!) 182/90     Pulse Rate 04/18/18 1119 67     Resp 04/18/18 1119 15     Temp --      Temp src --      SpO2 04/18/18 1119 99 %     Weight 04/18/18 1056 159 lb 6.3 oz (72.3 kg)     Height 04/18/18 1056 5\' 5"  (1.651 m)     Head Circumference --      Peak Flow --      Pain Score 04/18/18 1056 0     Pain Loc --      Pain Edu? --      Excl. in GC? --     Constitutional: Alert and oriented. Well appearing and in no acute distress. Eyes: Conjunctivae are normal. PERRL. EOMI. Head: Atraumatic. Nose: No congestion/rhinnorhea. Mouth/Throat: Mucous membranes are moist.  Oropharynx non-erythematous. Neck: No stridor.  Cardiovascular: Normal rate, regular rhythm. Grossly normal heart sounds.  Good peripheral circulation. Respiratory: Normal respiratory effort.  No retractions. Lungs CTAB. Gastrointestinal: Soft and nontender. No distention. No abdominal bruits. No CVA tenderness. Musculoskeletal: No lower extremity tenderness nor edema.   Neurologic:  Normal speech and language.  There is some right facial droop and not sure how long this last.  Patient is little slow on doing finger-to-nose testing rapid alternating movements and fingers are normal.  Patient reports some left-sided numbness but is not really sure when it seems to started I understand that she has had several episodes and this  last episode started just prior to arrival here.  I do not find evidence of muscular weakness. Skin:  Skin is warm, dry and intact. No rash noted.   ____________________________________________   LABS (all labs ordered are listed, but only abnormal results are displayed)  Labs Reviewed  CBC - Abnormal; Notable for the following components:      Result Value   RBC 5.79 (*)    Hemoglobin 15.7 (*)    HCT 48.6 (*)    All other components within normal limits  COMPREHENSIVE METABOLIC PANEL - Abnormal; Notable for the following components:    Glucose, Bld 273 (*)    All other components within normal limits  GLUCOSE, CAPILLARY - Abnormal; Notable for the following components:   Glucose-Capillary 269 (*)    All other components within normal limits  DIFFERENTIAL  TROPONIN I  PROTIME-INR  APTT  CBG MONITORING, ED   ____________________________________________  EKG  EKG read interpreted by me shows normal sinus rhythm rate of 72 normal axis there is slight but not significant ST elevation in 3 and half. ____________________________________________  RADIOLOGY  ED MD interpretation: CT read by radiology reviewed by me shows left frontal white matter hypodensity consistent with stroke of uncertain age.  Official radiology report(s): Ct Head Code Stroke Wo Contrast  Result Date: 04/18/2018 CLINICAL DATA:  Code stroke.  Right facial droop EXAM: CT HEAD WITHOUT CONTRAST TECHNIQUE: Contiguous axial images were obtained from the base of the skull through the vertex without intravenous contrast. COMPARISON:  None. FINDINGS: Brain: Hypodensity left frontal white matter compatible with infarct of indeterminate age. Possible recent infarct. Right frontal periventricular white matter most consistent with chronic ischemia. Negative for hemorrhage or mass. Ventricle size normal without midline shift. Vascular: Negative for hyperdense vessel Skull: Negative Sinuses/Orbits: Negative Other: None ASPECTS (Alberta Stroke Program Early CT Score) - Ganglionic level infarction (caudate, lentiform nuclei, internal capsule, insula, M1-M3 cortex): 7 - Supraganglionic infarction (M4-M6 cortex): 3 Total score (0-10 with 10 being normal): 10 IMPRESSION: 1. Hypodensity left frontal white matter compatible with ischemia of indeterminate age. Negative for hemorrhage 2. ASPECTS is 10 3. These results were called by telephone at the time of interpretation on 04/18/2018 at 11:22 am to Dr. Dorothea Glassman , who verbally acknowledged these results. Electronically Signed    By: Marlan Palau M.D.   On: 04/18/2018 11:23    ____________________________________________   PROCEDURES  Procedure(s) performed (including Critical Care):  Procedures   ____________________________________________   INITIAL IMPRESSION / ASSESSMENT AND PLAN / ED COURSE Dr. Thad Ranger saw the patient as well I discussed the patient with Dr. Thad Ranger.  Dr. Thad Ranger does not feel patient is a candidate for TPA at this point.  She does feel we should get her in the hospital do a stroke work-up.             ____________________________________________   FINAL CLINICAL IMPRESSION(S) / ED DIAGNOSES  Final diagnoses:  Cerebrovascular accident (CVA), unspecified mechanism [facility])     ED Discharge Orders    None       Note:  This document was prepared using Dragon voice recognition software and may include unintentional dictation errors.    Arnaldo Natal, MD 04/18/18 [phone]

## 2018-04-18 NOTE — ED Notes (Signed)
CBG completed 269 Brandi Ivan RN aware pt to CT

## 2018-04-18 NOTE — Progress Notes (Signed)
Advanced care plan. Purpose of the Encounter: CODE STATUS Parties in Attendance: Patient Patient's Decision Capacity: Good Subjective/Patient's story:  Brandi Rojas  is a 63 y.o. female no significant past medical history presented to the emergency room with right facial droop and left arm and left leg numbness and weakness which started few days ago no evidence of any seizure-like activity.  Patient felt lightheaded and dizzy.  Objective/Medical story Patient was worked up with CT head which showed left frontal white matter hypodensity of indeterminate age.  Needs further work-up with MRI and MRA brain for CVA and neurology consult. Goals of care determination:  Advance care directives goals of care and treatment plan discussed. Patient wants everything done which includes CPR, intubation ventilator if the need arises. CODE STATUS: Full code Time spent discussing advanced care planning: 16 minutes

## 2018-04-18 NOTE — ED Notes (Addendum)
ED TO INPATIENT HANDOFF REPORT  ED Nurse Name and Phone #: Annie Main [phone]  S Name/Age/Gender Brandi Rojas 63 y.o. female Room/Bed: ED02A/ED02A  Code Status   Code Status: Not on file  Home/SNF/Other Home Patient oriented to: self, place, time and situation Is this baseline? Yes   Triage Complete: Triage complete  Chief Complaint AMS   Triage Note Pt to ED from home brought in by daughter states left sided numbness for a couple days, right facial droop, and new worsening weakness and dizziness today around 1040.  Pt presents A&Ox4, slight slurring of words but understandable, denies pain.  Pt taken to CT, neurology met pt in CT and with pt upon arrival to ED room.   Allergies No Known Allergies  Level of Care/Admitting Diagnosis ED Disposition    ED Disposition Condition Comment   Admit  Hospital Area: [facility] [[id number]]  Level of Care: Med-Surg [16]  Diagnosis: TIA (transient ischemic attack) [038882]  Admitting Physician: Saundra Shelling [[id number]]  Attending Physician: Saundra Shelling [[id number]]  PT Class (Do Not Modify): Observation [104]  PT Acc Code (Do Not Modify): Observation [10022]       B Medical/Surgery History History reviewed. No pertinent past medical history. History reviewed. No pertinent surgical history.   A IV Location/Drains/Wounds Patient Lines/Drains/Airways Status   Active Line/Drains/Airways    Name:   Placement date:   Placement time:   Site:   Days:   Peripheral IV 04/18/18 Left Antecubital   04/18/18    1114    Antecubital   less than 1          Intake/Output Last 24 hours No intake or output data in the 24 hours ending 04/18/18 1255  Labs/Imaging Results for orders placed or performed during the hospital encounter of 04/18/18 (from the past 48 hour(s))  Glucose, capillary     Status: Abnormal   Collection Time: 04/18/18 10:59 AM  Result Value Ref Range   Glucose-Capillary 269 (H) 70 - 99 mg/dL  CBC      Status: Abnormal   Collection Time: 04/18/18 11:15 AM  Result Value Ref Range   WBC 8.3 4.0 - 10.5 K/uL   RBC 5.79 (H) 3.87 - 5.11 MIL/uL   Hemoglobin 15.7 (H) 12.0 - 15.0 g/dL   HCT 48.6 (H) 36.0 - 46.0 %   MCV 83.9 80.0 - 100.0 fL   MCH 27.1 26.0 - 34.0 pg   MCHC 32.3 30.0 - 36.0 g/dL   RDW 12.9 11.5 - 15.5 %   Platelets 273 150 - 400 K/uL   nRBC 0.0 0.0 - 0.2 %    Comment: Performed at [facility], [address] [place], [place] [address]  Differential     Status: None   Collection Time: 04/18/18 11:15 AM  Result Value Ref Range   Neutrophils Relative % 55 %   Neutro Abs 4.5 1.7 - 7.7 K/uL   Lymphocytes Relative 39 %   Lymphs Abs 3.2 0.7 - 4.0 K/uL   Monocytes Relative 5 %   Monocytes Absolute 0.4 0.1 - 1.0 K/uL   Eosinophils Relative 1 %   Eosinophils Absolute 0.1 0.0 - 0.5 K/uL   Basophils Relative 0 %   Basophils Absolute 0.0 0.0 - 0.1 K/uL   Immature Granulocytes 0 %   Abs Immature Granulocytes 0.03 0.00 - 0.07 K/uL    Comment: Performed at [facility], [address], [place], [place] [address]  Comprehensive metabolic panel  Status: Abnormal   Collection Time: 04/18/18 11:15 AM  Result Value Ref Range   Sodium 138 135 - 145 mmol/L   Potassium 4.4 3.5 - 5.1 mmol/L    Comment: HEMOLYSIS AT THIS LEVEL MAY AFFECT RESULT   Chloride 100 98 - 111 mmol/L   CO2 24 22 - 32 mmol/L   Glucose, Bld 273 (H) 70 - 99 mg/dL   BUN 12 8 - 23 mg/dL   Creatinine, Ser 0.69 0.44 - 1.00 mg/dL   Calcium 9.1 8.9 - 10.3 mg/dL   Total Protein 8.0 6.5 - 8.1 g/dL   Albumin 4.2 3.5 - 5.0 g/dL   AST 26 15 - 41 U/L   ALT 19 0 - 44 U/L   Alkaline Phosphatase 83 38 - 126 U/L   Total Bilirubin 1.2 0.3 - 1.2 mg/dL   GFR calc non Af Amer >60 >60 mL/min   GFR calc Af Amer >60 >60 mL/min   Anion gap 14 5 - 15    Comment: Performed at [facility], [address] [place], [place] [address]  Troponin I - Add-On to previous collection     Status: None    Collection Time: 04/18/18 11:15 AM  Result Value Ref Range   Troponin I <0.03 <0.03 ng/mL    Comment: Performed at [facility], [address] [place], [place] [address]  Protime-INR     Status: None   Collection Time: 04/18/18 12:05 PM  Result Value Ref Range   Prothrombin Time 12.0 11.4 - 15.2 seconds   INR 0.9 0.8 - 1.2    Comment: (NOTE) INR goal varies based on device and disease states. Performed at [facility], [address] [place], [place] [address]   APTT     Status: None   Collection Time: 04/18/18 12:05 PM  Result Value Ref Range   aPTT 25 24 - 36 seconds    Comment: Performed at [facility], [address] [place], [place] [address]   Ct Head Code Stroke Wo Contrast  Result Date: 04/18/2018 CLINICAL DATA:  Code stroke.  Right facial droop EXAM: CT HEAD WITHOUT CONTRAST TECHNIQUE: Contiguous axial images were obtained from the base of the skull through the vertex without intravenous contrast. COMPARISON:  None. FINDINGS: Brain: Hypodensity left frontal white matter compatible with infarct of indeterminate age. Possible recent infarct. Right frontal periventricular white matter most consistent with chronic ischemia. Negative for hemorrhage or mass. Ventricle size normal without midline shift. Vascular: Negative for hyperdense vessel Skull: Negative Sinuses/Orbits: Negative Other: None ASPECTS ([place] Stroke Program Early CT Score) - Ganglionic level infarction (caudate, lentiform nuclei, internal capsule, insula, M1-M3 cortex): 7 - Supraganglionic infarction (M4-M6 cortex): 3 Total score (0-10 with 10 being normal): 10 IMPRESSION: 1. Hypodensity left frontal white matter compatible with ischemia of indeterminate age. Negative for hemorrhage 2. ASPECTS is 10 3. These results were called by telephone at the time of interpretation on 04/18/2018 at 11:22 am to Dr. Conni Slipper , who verbally acknowledged these results. Electronically Signed   By:  Franchot Gallo M.D.   On: 04/18/2018 11:23    Pending Labs [employer] (From admission, onward)    Start     Ordered   Signed and Held  HIV antibody (Routine Testing)  Once,   R     Signed and Held   Signed and Held  Lipid panel  Tomorrow morning,   R    Comments:  Fasting    Signed and  Held   Signed and Held  CBC  (enoxaparin (LOVENOX)    CrCl >/= 30 ml/min)  Once,   R    Comments:  Baseline for enoxaparin therapy IF NOT ALREADY DRAWN.  Notify MD if PLT < 100 K.    Signed and Held   Signed and Held  Creatinine, serum  (enoxaparin (LOVENOX)    CrCl >/= 30 ml/min)  Once,   R    Comments:  Baseline for enoxaparin therapy IF NOT ALREADY DRAWN.    Signed and Held   Signed and Held  Creatinine, serum  (enoxaparin (LOVENOX)    CrCl >/= 30 ml/min)  Weekly,   R    Comments:  while on enoxaparin therapy    Signed and Held          Vitals/Pain Today's Vitals   04/18/18 1056 04/18/18 1115 04/18/18 1119  BP:  (!) 182/90   Pulse:   67  Resp:   15  SpO2:   99%  Weight: 72.3 kg    Height: '5\' 5"'  (1.651 m)    PainSc: 0-No pain      Isolation Precautions No active isolations  Medications Medications  sodium chloride flush (NS) 0.9 % injection 3 mL (has no administration in time range)    Mobility walks Low fall risk   Focused Assessments Neuro Assessment Handoff:  Swallow screen pass? Yes    NIH Stroke Scale ( + Modified Stroke Scale Criteria)  Interval: Initial Level of Consciousness (1a.)   : Alert, keenly responsive LOC Questions (1b. )   +: Answers both questions correctly LOC Commands (1c. )   + : Performs both tasks correctly Best Gaze (2. )  +: Normal Visual (3. )  +: No visual loss Facial Palsy (4. )    : Minor paralysis Motor Arm, Left (5a. )   +: No drift Motor Arm, Right (5b. )   +: No drift Motor Leg, Left (6a. )   +: No drift Motor Leg, Right (6b. )   +: No drift Limb Ataxia (7. ): Present in one limb Sensory (8. )   +: Mild-to-moderate sensory  loss, patient feels pinprick is less sharp or is dull on the affected side, or there is a loss of superficial pain with pinprick, but patient is aware of being touched Best Language (9. )   +: No aphasia Dysarthria (10. ): Mild-to-moderate dysarthria, patient slurs at least some words and, at worst, can be understood with some difficulty Extinction/Inattention (11.)   +: No Abnormality Modified SS Total  +: 1 Complete NIHSS TOTAL: 4 Last date known well: 04/15/18   Neuro Assessment: Exceptions to [facility] Neuro Checks:   Initial (04/18/18 1112)  Last Documented NIHSS Modified Score: 1 (04/18/18 1112) Has TPA been given? No If patient is a Neuro Trauma and patient is going to OR before floor call report to [place] nurse: [phone] or [phone]     R Recommendations: See Admitting Provider Note  Report given to: Cherlyn Labella, RN  Additional Notes:

## 2018-04-18 NOTE — ED Notes (Signed)
Pt states symptoms started 2 days ago, new symptoms of weakness and dizziness today around 1040.

## 2018-04-19 DIAGNOSIS — I639 Cerebral infarction, unspecified: Principal | ICD-10-CM

## 2018-04-19 LAB — LIPID PANEL
Cholesterol: 259 mg/dL — ABNORMAL HIGH (ref 0–200)
HDL: 33 mg/dL — ABNORMAL LOW (ref >40)
LDL Cholesterol: UNDETERMINED mg/dL (ref 0–99)
Total CHOL/HDL Ratio: 7.8 RATIO
Triglycerides: 547 mg/dL — ABNORMAL HIGH (ref <150)
VLDL: UNDETERMINED mg/dL (ref 0–40)

## 2018-04-19 LAB — GLUCOSE, CAPILLARY: Glucose-Capillary: 340 mg/dL — ABNORMAL HIGH (ref 70–99)

## 2018-04-19 LAB — HIV ANTIBODY (ROUTINE TESTING W REFLEX): HIV Screen 4th Generation wRfx: NONREACTIVE

## 2018-04-19 LAB — HEMOGLOBIN A1C
Hgb A1c MFr Bld: 11.8 % — ABNORMAL HIGH (ref 4.8–5.6)
Mean Plasma Glucose: 291.96 mg/dL

## 2018-04-19 MED ORDER — ASPIRIN EC 81 MG PO TBEC
81.0000 mg | DELAYED_RELEASE_TABLET | Freq: Every day | ORAL | Status: DC
  Administered 2018-04-19: 81 mg via ORAL
  Filled 2018-04-19: qty 1

## 2018-04-19 MED ORDER — BLOOD GLUCOSE MONITOR KIT: PACK | 0 refills | Status: AC

## 2018-04-19 MED ORDER — NICOTINE 21 MG/24HR TD PT24: 21.0000 mg | MEDICATED_PATCH | Freq: Every day | TRANSDERMAL | 0 refills | Status: DC

## 2018-04-19 MED ORDER — INSULIN GLARGINE 100 UNIT/ML ~~LOC~~ SOLN: SUBCUTANEOUS | 3 refills | Status: AC

## 2018-04-19 MED ORDER — CLOPIDOGREL BISULFATE 75 MG PO TABS
75.0000 mg | ORAL_TABLET | Freq: Every day | ORAL | Status: DC
  Administered 2018-04-19: 12:00:00 75 mg via ORAL
  Filled 2018-04-19: qty 1

## 2018-04-19 MED ORDER — ROSUVASTATIN CALCIUM 40 MG PO TABS: 40.0000 mg | ORAL_TABLET | Freq: Every day | ORAL | 0 refills | Status: DC

## 2018-04-19 MED ORDER — INSULIN ASPART 100 UNIT/ML ~~LOC~~ SOLN
0.0000 [IU] | Freq: Three times a day (TID) | SUBCUTANEOUS | Status: DC
  Administered 2018-04-19: 7 [IU] via SUBCUTANEOUS
  Filled 2018-04-19: qty 1

## 2018-04-19 MED ORDER — CLOPIDOGREL BISULFATE 75 MG PO TABS: 75.0000 mg | ORAL_TABLET | Freq: Every day | ORAL | 0 refills | Status: AC

## 2018-04-19 MED ORDER — INSULIN GLARGINE 100 UNIT/ML ~~LOC~~ SOLN
10.0000 [IU] | Freq: Every day | SUBCUTANEOUS | Status: DC
  Administered 2018-04-19: 14:00:00 10 [IU] via SUBCUTANEOUS
  Filled 2018-04-19 (×2): qty 0.1

## 2018-04-19 MED ORDER — AMLODIPINE BESYLATE 5 MG PO TABS: 5.0000 mg | ORAL_TABLET | Freq: Every day | ORAL | 0 refills | Status: AC

## 2018-04-19 MED ORDER — ASPIRIN 81 MG PO TBEC: 81.0000 mg | DELAYED_RELEASE_TABLET | Freq: Every day | ORAL | 0 refills | Status: AC

## 2018-04-19 MED ORDER — LIVING WELL WITH DIABETES BOOK
Freq: Once | Status: AC
  Administered 2018-04-19: 13:00:00

## 2018-04-19 NOTE — TOC Transition Note (Signed)
Transition of Care [facility]) - CM/SW Discharge Note   Patient Details  Name: Brandi Rojas MRN: [id number] Date of Birth: Apr 27, 1955  Transition of [facility]) CM/SW Contact:  Latanya Maudlin, RN Phone Number: 04/19/2018, 12:04 PM   Clinical Narrative:  Patient set to discharge today. Patient is without insurance and is newly diagnosed diabetic. TOC team provided patient with glucometer testing kit. Patient also a candidate for MATCH. Provided voucher and patient plans to fill scripts at [facility] which is listed as a [facility] provider.      Final next level of care: Home/Self Care Barriers to Discharge: No Barriers Identified   Patient Goals and CMS Choice        Discharge Placement                       Discharge Plan and Services   Discharge Planning Services: CM Consult, [facility]                      Social Determinants of Health (SDOH) Interventions     Readmission Risk Interventions No flowsheet data found.

## 2018-04-19 NOTE — Progress Notes (Signed)
Subjective: Patient reports feeling improved today.  No new neurological complaints.    Objective: Current vital signs: BP (!) 147/74 (BP Location: Left Arm)   Pulse 66   Temp 98.3 F (36.8 C) (Oral)   Resp 18   Ht 5\' 5"  (1.651 m)   Wt 72.3 kg   SpO2 96%   BMI 26.52 kg/m  Vital signs in last 24 hours: Temp:  [97.9 F (36.6 C)-98.5 F (36.9 C)] 98.3 F (36.8 C) (03/28 2683) Pulse Rate:  [61-75] 66 (03/28 0938) Resp:  [15-20] 18 (03/28 0938) BP: (102-183)/(74-96) 147/74 (03/28 0938) SpO2:  [92 %-100 %] 96 % (03/28 0938) Weight:  [72.3 kg] 72.3 kg (03/27 1056)  Intake/Output from previous day: 03/27 0701 - 03/28 0700 In: 240 [P.O.:240] Out: -  Intake/Output this shift: No intake/output data recorded. Nutritional status:  Diet Order            Diet regular Room service appropriate? Yes; Fluid consistency: Thin  Diet effective now              Neurologic Exam: Mental Status: Alert, oriented, thought content appropriate. No aphasia. Able to follow 3 step commands without difficulty. Attention span and concentration seemed appropriate  Cranial Nerves: II: Discs flat bilaterally; Visual fields grossly normal, pupils equal, round, reactive to light and accommodation III,IV, VI: ptosis not present, extra-ocular motions intact bilaterally V,VII: smile asymmetric, right facial droop,  facial light touch sensationdecreased on the left VIII: hearing normal bilaterally IX,X: gag reflex present XI: bilateral shoulder shrug XII: midline tongue extension Motor: Able to lift all extremities against gravity with RUE pronator drift noted.   Sensory: Pinprick and light touch intact Deep Tendon Reflexes: 2+ and symmetric throughout Plantars: Right:muteLeft: mute Cerebellar: Finger-to-nosetesting intact bilaterally.Heel to shin testing normal bilaterally Gait: not tested due to safety concerns   Lab Results: Basic Metabolic Panel: Recent  Labs  Lab 04/18/18 1115  NA 138  K 4.4  CL 100  CO2 24  GLUCOSE 273*  BUN 12  CREATININE 0.69  CALCIUM 9.1    Liver Function Tests: Recent Labs  Lab 04/18/18 1115  AST 26  ALT 19  ALKPHOS 83  BILITOT 1.2  PROT 8.0  ALBUMIN 4.2   No results for input(s): LIPASE, AMYLASE in the last 168 hours. No results for input(s): AMMONIA in the last 168 hours.  CBC: Recent Labs  Lab 04/18/18 1115  WBC 8.3  NEUTROABS 4.5  HGB 15.7*  HCT 48.6*  MCV 83.9  PLT 273    Cardiac Enzymes: Recent Labs  Lab 04/18/18 1115  TROPONINI <0.03    Lipid Panel: Recent Labs  Lab 04/18/18 1601 04/19/18 0514  CHOL 276* 259*  TRIG 317* 547*  HDL 40* 33*  CHOLHDL 6.9 7.8  VLDL 63* UNABLE TO CALCULATE IF TRIGLYCERIDE OVER 400 mg/dL  LDLCALC 419* UNABLE TO CALCULATE IF TRIGLYCERIDE OVER 400 mg/dL    CBG: Recent Labs  Lab 04/18/18 1059 04/18/18 1741  GLUCAP 269* 214*    Microbiology: Results for orders placed or performed during the hospital encounter of 05/14/15  Body fluid culture     Status: None   Collection Time: 05/14/15  1:48 PM  Result Value Ref Range Status   Specimen Description SYNOVIAL LEFT ANKLE  Final   Special Requests Normal  Final   Gram Stain   Final    MODERATE WBC PRESENT, PREDOMINANTLY PMN NO ORGANISMS SEEN    Culture NO GROWTH 3 DAYS  Final   Report Status  05/18/2015 FINAL  Final    Coagulation Studies: Recent Labs    04/18/18 1205  LABPROT 12.0  INR 0.9    Imaging: Mr Shirlee Latch XT Contrast  Result Date: 04/18/2018 CLINICAL DATA:  Left-sided numbness and right-sided facial droop over the last few days. Weakness and dizziness. EXAM: MRI HEAD WITHOUT CONTRAST MRA HEAD WITHOUT CONTRAST TECHNIQUE: Multiplanar, multiecho pulse sequences of the brain and surrounding structures were obtained without intravenous contrast. Angiographic images of the head were obtained using MRA technique without contrast. COMPARISON:  Head CT same day FINDINGS: MRI HEAD  FINDINGS Brain: Diffusion imaging shows numerous acute watershed infarctions affecting the white matter of the left hemisphere from the frontal through parietal regions. Few tiny foci punctate cortical infarction. No large confluent infarction. Elsewhere, there is chronic small vessel ischemic change in the right para median pons. No focal cerebellar insult. Cerebral hemispheres elsewhere show mild chronic small-vessel ischemic change of the white matter. No mass lesion, hemorrhage, hydrocephalus or extra-axial collection. Vascular: Major vessels at the base of the brain show flow. Skull and upper cervical spine: Negative Sinuses/Orbits: Clear/normal Other: None MRA HEAD FINDINGS Both internal carotid arteries are patent through the skull base and siphon regions. No siphon stenosis. On the right, the anterior and middle cerebral vessels are patent without proximal stenosis, aneurysm or vascular malformation. More distal branch vessels show some atherosclerotic irregularity. On the left, the anterior cerebral artery shows flow with a moderate stenosis at the A1 segment. The left M1 segment is patent. There is occlusion of the superior division M2 branch consistent with embolic occlusion. Both vertebral arteries are patent to the basilar. The right is dominant. No basilar stenosis. Posterior circulation branch vessels are patent. There are multiple stenoses throughout the PCA territories. 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 Small foci of cortical infarction. No evidence of hemorrhage or swelling. Occlusion of the left M2 superior division consistent with embolic occlusion. Mild chronic small-vessel ischemic changes elsewhere of the cerebral hemispheric white matter. Additional distal vessel atherosclerotic irregularity throughout the intracranial branch vessels, particularly notable in the PCA branches.  These results were called by telephone at the time of interpretation on 04/18/2018 at 2:02 pm to Dr. Darnelle Catalan , who verbally acknowledged these results. Electronically Signed   By: Paulina Fusi M.D.   On: 04/18/2018 14:07   Mr Brain Wo Contrast  Result Date: 04/18/2018 CLINICAL DATA:  Left-sided numbness and right-sided facial droop over the last few days. Weakness and dizziness. EXAM: MRI HEAD WITHOUT CONTRAST MRA HEAD WITHOUT CONTRAST TECHNIQUE: Multiplanar, multiecho pulse sequences of the brain and surrounding structures were obtained without intravenous contrast. Angiographic images of the head were obtained using MRA technique without contrast. COMPARISON:  Head CT same day FINDINGS: MRI HEAD FINDINGS Brain: Diffusion imaging shows numerous acute watershed infarctions affecting the white matter of the left hemisphere from the frontal through parietal regions. Few tiny foci punctate cortical infarction. No large confluent infarction. Elsewhere, there is chronic small vessel ischemic change in the right para median pons. No focal cerebellar insult. Cerebral hemispheres elsewhere show mild chronic small-vessel ischemic change of the white matter. No mass lesion, hemorrhage, hydrocephalus or extra-axial collection. Vascular: Major vessels at the base of the brain show flow. Skull and upper cervical spine: Negative Sinuses/Orbits: Clear/normal Other: None MRA HEAD FINDINGS Both internal carotid arteries are patent through the skull base and siphon regions. No siphon stenosis. On the  right, the anterior and middle cerebral vessels are patent without proximal stenosis, aneurysm or vascular malformation. More distal branch vessels show some atherosclerotic irregularity. On the left, the anterior cerebral artery shows flow with a moderate stenosis at the A1 segment. The left M1 segment is patent. There is occlusion of the superior division M2 branch consistent with embolic occlusion. Both vertebral arteries are  patent to the basilar. The right is dominant. No basilar stenosis. Posterior circulation branch vessels are patent. There are multiple stenoses throughout the PCA territories. 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 Small foci of cortical infarction. No evidence of hemorrhage or swelling. Occlusion of the left M2 superior division consistent with embolic occlusion. Mild chronic small-vessel ischemic changes elsewhere of the cerebral hemispheric white matter. Additional distal vessel atherosclerotic irregularity throughout the intracranial branch vessels, particularly notable in the PCA branches. These results were called by telephone at the time of interpretation on 04/18/2018 at 2:02 pm to Dr. Darnelle CatalanMalinda , who verbally acknowledged these results. Electronically Signed   By: Paulina FusiMark  Shogry M.D.   On: 04/18/2018 14:07   [place] Carotid Bilateral  Result Date: 04/18/2018 CLINICAL DATA:  Cerebral infarction. EXAM: BILATERAL CAROTID DUPLEX ULTRASOUND TECHNIQUE: Wallace CullensGray scale imaging, color Doppler and duplex ultrasound were performed of bilateral carotid and vertebral arteries in the neck. COMPARISON:  None. FINDINGS: Criteria: Quantification of carotid stenosis is based on velocity parameters that correlate the residual internal carotid diameter with NASCET-based stenosis levels, using the diameter of the distal internal carotid lumen as the denominator for stenosis measurement. The following velocity measurements were obtained: RIGHT ICA:  117/35 cm/sec CCA:  71/13 cm/sec SYSTOLIC ICA/CCA RATIO:  1.6 ECA:  60 cm/sec LEFT ICA:  105/32 cm/sec CCA:  80/13 cm/sec SYSTOLIC ICA/CCA RATIO:  1.3 ECA:  75 cm/sec RIGHT CAROTID ARTERY: Mild amount of partially calcified plaque is present at the level of the distal common carotid artery, carotid bulb and proximal right ICA. Estimated right ICA stenosis is less than 50%. RIGHT  VERTEBRAL ARTERY: Antegrade flow with normal waveform and velocity. LEFT CAROTID ARTERY: Mild amount of predominately calcified plaque is present at the level of the left carotid bulb and proximal left ICA. Estimated left ICA stenosis is less than 50%. LEFT VERTEBRAL ARTERY: Antegrade flow with normal waveform and velocity. IMPRESSION: Mild amount of plaque at both carotid bulbs and proximal internal carotid arteries. Estimated bilateral ICA stenoses are less than 50%. Electronically Signed   By: Irish LackGlenn  Yamagata M.D.   On: 04/18/2018 14:33   Ct Head Code Stroke Wo Contrast  Result Date: 04/18/2018 CLINICAL DATA:  Code stroke.  Right facial droop EXAM: CT HEAD WITHOUT CONTRAST TECHNIQUE: Contiguous axial images were obtained from the base of the skull through the vertex without intravenous contrast. COMPARISON:  None. FINDINGS: Brain: Hypodensity left frontal white matter compatible with infarct of indeterminate age. Possible recent infarct. Right frontal periventricular white matter most consistent with chronic ischemia. Negative for hemorrhage or mass. Ventricle size normal without midline shift. Vascular: Negative for hyperdense vessel Skull: Negative Sinuses/Orbits: Negative Other: None ASPECTS ([place] Stroke Program Early CT Score) - Ganglionic level infarction (caudate, lentiform nuclei, internal capsule, insula, M1-M3 cortex): 7 - Supraganglionic infarction (M4-M6 cortex): 3 Total score (0-10 with 10 being normal): 10 IMPRESSION: 1. Hypodensity left frontal white matter compatible with ischemia of indeterminate age. Negative for hemorrhage 2. ASPECTS is 10 3. These results were called by telephone at  the time of interpretation on 04/18/2018 at 11:22 am to Dr. Dorothea Glassman , who verbally acknowledged these results. Electronically Signed   By: Marlan Palau M.D.   On: 04/18/2018 11:23    Medications:  I have reviewed the patient's current medications. Scheduled: . aspirin  300 mg Rectal Daily   Or   . aspirin  325 mg Oral Daily  . enoxaparin (LOVENOX) injection  40 mg Subcutaneous Q24H  . nicotine  21 mg Transdermal Daily    Assessment/Plan: Patient stable overnight.  MRI of the brain reviewed and shows what appears to be acute watershed infarcts in the left frontal and parietal lobes.  MRA shows left superior M2 occlusion felt to be embolic in etiology.  Patient does not have an outpateint physician and has not been to one in some time.  Carotid dopplers show no evidence of hemodynamically significant stenosis.  Echocardiogram is pending.  A1c 11.8, LDL 173.  UDS positive for THC.  Initial BP's elevated but currently blood pressure improved.   Patient with multiple unaddressed risk factors that have likely led to this presentation.    Recommendations: 1.  Smoking cessation counseling 2.  ASA  and Plavix  daily 3.  Patient with newly diagnosed DM.  Blood sugar management with target A1c<7.0 4.  Statin for lipid management with target LDL<70. 5.  Agree with liberalized BP management at this time.    6.  Echocardiogram pending 7.  Follow up with neurology on an outpatient basis   LOS: 1 day   Thana Farr, MD Neurology [phone] 04/19/2018  9:47 AM

## 2018-04-19 NOTE — Progress Notes (Signed)
Chart reviewed, Pt visited. Pt observed with several sips of water during screen with no s/s of aspiration. Pt reports no dysphagia or speech deficits. Speech was 100 percent intelligible but did note very mild dysarthria along with slight facial droop. Pt also does not have teeth or dentures which affects articulation. Pt stated that her speech is 'normal' and no different than prior to this event. Plans for discharge home today with [facility] services. St not likely indicated since speech is intelligible.

## 2018-04-19 NOTE — Evaluation (Signed)
Occupational Therapy Evaluation Patient Details Name: Brandi Rojas MRN: [id number] DOB: [birth year] Today's Date: 04/19/2018    History of Present Illness Pt. is a 63 y.o. female no significant past medical history presented to the emergency room with right facial droop and left arm and left leg numbness and weakness, slurred speech, which started few days ago no evidence of any seizure-like activity. Patient felt lightheaded and dizzy. She was worked up with CT head which showed hypodensities in the left frontal white matter of indeterminate age.  No evidence of hemorrage.   Clinical Impression   Pt. Presents with weakness, limited activity tolerance, impaired coordination, sensation, cognition. and limited functional mobility which limits the ability to complete basic ADL and IADL functioning. Pt. Resides at home with her daughter. Pt. Was independent with ADLs, and IADL functioning: including meal preparation, and medication management. Pt. Reports that her daughter was getting ready to take over her medication management. Pt. will benefit from OT services for ADL training, A/E training, neuro muscular re-education, and pt. Education about cognitive, and visual compensatory strategies, home modification, and DME. Pt. would benefit from follow-up OT services upon discharge.    Follow Up Recommendations  Home health OT    Equipment Recommendations       Recommendations for Other Services       Precautions / Restrictions Precautions Precautions: Fall Restrictions Weight Bearing Restrictions: No      Mobility Bed Mobility Overal bed mobility: Modified Independent Bed Mobility: Supine to Sit;Sit to Supine     Supine to sit: Modified independent (Device/Increase time) Sit to supine: Supervision   General bed mobility comments: Mobility: Per PT report  Transfers Overall transfer level: Needs assistance Equipment used: None Transfers: Sit to/from Stand Sit to Stand: Min guard          General transfer comment: Mobility: Per PT report    Balance Overall balance assessment: Needs assistance Sitting-balance support: Feet supported;Bilateral upper extremity supported Sitting balance-Leahy Scale: Good     Standing balance support: No upper extremity supported Standing balance-Leahy Scale: Fair                             ADL either performed or assessed with clinical judgement   ADL Overall ADL's : Needs assistance/impaired Eating/Feeding: Set up;Independent   Grooming: Set up;Independent   Upper Body Bathing: Set up;Supervision/ safety   Lower Body Bathing: Set up;Minimal assistance   Upper Body Dressing : Set up;Supervision/safety   Lower Body Dressing: Set up;Minimal assistance                       Vision Patient Visual Report: Blurring of vision(Left eye)       Perception     Praxis      Pertinent Vitals/Pain Pain Assessment: No/denies pain     Hand Dominance Right   Extremity/Trunk Assessment Upper Extremity Assessment RUE Deficits / Details: RUE. grossly 4+/5 RUE Sensation: WNL RUE Coordination: WNL LUE Deficits / Details:  4+/5  LUE Sensation: decreased light touch LUE Coordination: decreased fine motor;decreased gross motor           Communication Communication Communication: No difficulties   Cognition Arousal/Alertness: Awake/alert Behavior During Therapy: WFL for tasks assessed/performed;Impulsive;Flat affect Overall Cognitive Status: Within Functional Limits for tasks assessed  General Comments: Patient mildly impulsive, eager to move. Vocalizes few words   General Comments       Exercises   Shoulder Instructions      Home Living Family/patient expects to be discharged to:: Private residence Living Arrangements: Children Available Help at Discharge: Family Type of Home: House       Home Layout: Two level;1/2 bath on main level      Bathroom Shower/Tub: Chief Strategy Officer: Standard     Home Equipment: None          Prior Functioning/Environment Level of Independence: Independent                 OT Problem List: Decreased strength;Decreased range of motion;Decreased coordination;Pain;Impaired UE functional use;Decreased knowledge of use of DME or AE;Decreased activity tolerance      OT Treatment/Interventions: Self-care/ADL training;Therapeutic exercise;Patient/family education;Therapeutic activities;DME and/or AE instruction;Neuromuscular education    OT Goals(Current goals can be found in the care plan section) Acute Rehab OT Goals Patient Stated Goal: To rerurn to normal OT Goal Formulation: With patient Potential to Achieve Goals: Good  OT Frequency: Min 2X/week   Barriers to D/C:            Co-evaluation              AM-PAC OT "6 Clicks" Daily Activity     Outcome Measure Help from another person eating meals?: None Help from another person taking care of personal grooming?: None Help from another person toileting, which includes using toliet, bedpan, or urinal?: A Little Help from another person bathing (including washing, rinsing, drying)?: A Little Help from another person to put on and taking off regular upper body clothing?: A Little Help from another person to put on and taking off regular lower body clothing?: A Little 6 Click Score: 20   End of Session Equipment Utilized During Treatment: Gait belt  Activity Tolerance: Patient tolerated treatment well Patient left: in bed  OT Visit Diagnosis: Muscle weakness (generalized) (M62.81);Unsteadiness on feet (R26.81)                Time: 2505-3976 OT Time Calculation (min): 16 min Charges:  OT General Charges $OT Visit: 1 Visit OT Evaluation $OT Eval Low Complexity: 1 Low  Olegario Messier, MS, OTR/L Olegario Messier 04/19/2018, 12:17 PM

## 2018-04-19 NOTE — Progress Notes (Signed)
Inpatient Diabetes Program Recommendations  AACE/ADA: New Consensus Statement on Inpatient Glycemic Control (2015)  Target Ranges:  Prepandial:   less than 140 mg/dL      Peak postprandial:   less than 180 mg/dL (1-2 hours)      Critically ill patients:  140 - 180 mg/dL   Results for Brandi Rojas, Brandi Rojas (MRN [id number]) as of 04/19/2018 14:14  Ref. Range 04/18/2018 10:59 04/18/2018 17:41 04/19/2018 12:10  Glucose-Capillary Latest Ref Range: 70 - 99 mg/dL 761 (H) 950 (H) 932 (H)   Results for Brandi Rojas, Brandi Rojas (MRN [id number]) as of 04/19/2018 14:14  Ref. Range 04/18/2018 11:15 04/18/2018 16:01  Glucose Latest Ref Range: 70 - 99 mg/dL 983 (H)   Hemoglobin J8S Latest Ref Range: 4.8 - 5.6 %  11.8 (H)    Review of Glycemic Control  Diabetes history: No Outpatient Diabetes medications: NA Current orders for Inpatient glycemic control: Lantus 10 units daily, Novolog 0-9 units TID with meals  Inpatient Diabetes Program Recommendations:  A1C: A1C 11.8% on 04/18/18 indicating an average glucose of 292 mg/dl over the past 2-3 months.   NOTE: Received page from RN indicating that patient is being newly dx with DM. RN notes that CM is consulted and Living Well with DM book has been ordered. Diabetes Coordinator is not on campus over the weekend. Spoke with patient over the phone about new diabetes diagnosis.  Discussed A1C results (11.8% on 04/18/18) and explained what an A1C is and informed patient that current A1C indicates an average glucose of 292 mg/dl over the past 2-3 months. Discussed basic pathophysiology of DM Type 2, basic home care, importance of checking CBGs and maintaining good CBG control to prevent long-term and short-term complications. Reviewed glucose and A1C goals.   Reviewed signs and symptoms of hyperglycemia and hypoglycemia along with treatment for both. Discussed impact of nutrition, exercise, stress, sickness, and medications on diabetes control. Discussed Carb Modified diet in detail.  Informed  patient that RN would be giving her Living Well with diabetes booklet and encouraged patient to read through entire book.  Informed patient that she will be prescribed insulin as an outpatient. Patient does not have any insurance and plans to follow up at [facility]. Per RN, CM provided MATCH voucher and glucometer and testing supplies.  Asked patient to check glucose 3-4 times per day (before meals and at bedtime) and to keep a log book of glucose readings and insulin taken. Explained how the doctor she follows up with can use the log book to continue to make insulin adjustments if needed. Informed patient that RN will be reviewing and demonstrated how to draw up and administer insulin with vial and syringe and how to check her glucose with glucometer.  Asked patient to be sure to call [facility] first thing Monday morning to get an appointment for follow up.  Patient verbalized understanding of information discussed and she states that she has no further questions at this time related to diabetes.   RNs to provide ongoing basic DM education at bedside with this patient and engage patient to actively check blood glucose and administer insulin injections.   Thanks, Orlando Penner, RN, MSN, CDE Diabetes Coordinator Inpatient Diabetes Program [phone] (Team Pager from 8am to 5pm)

## 2018-04-19 NOTE — Discharge Summary (Signed)
[facility] [place] NAME: Brandi Rojas    MR#:  [id number]  DATE OF BIRTH:  [birth year]  DATE OF ADMISSION:  04/18/2018 ADMITTING PHYSICIAN: Saundra Shelling, MD  DATE OF DISCHARGE: 04/19/2018  PRIMARY CARE PHYSICIAN: Patient, No Pcp Per    ADMISSION DIAGNOSIS:  Stroke (cerebrum) ([place]) [I63.9] Cerebrovascular accident (CVA), unspecified mechanism ([place]) [I63.9] CVA (cerebral vascular accident) ([place]) [I63.9]  DISCHARGE DIAGNOSIS:  cva  SECONDARY DIAGNOSIS:  History reviewed. No pertinent past medical history.  HOSPITAL COURSE:   63 year old female with history of tobacco dependence who presented to the emergency room With left-sided facial droop and dizziness.   1.  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 Patient started on aspirin and Plavix. A1c is 11.8 LDL 173 It was felt that this could be related to underlying high blood pressure. Patient will continue aspirin and Plavix.  She is started on statin with side effects, tremors and benefits discussed with the patient. Patient will need to follow-up with neurology. Carotid Doppler showed no hemodynamically significant stenosis. Echocardiogram with bubble study showed no evidence of thrombus.  2.  Undiagnosed hypertension: Patient is started on low-dose Norvasc which she will start in 2 days. She will need follow[facility]  3.  Newly diagnosed diabetes with A1c of 11.8 Patient will be discharged on Lantus as well as glucometer. 4. Tobacco dependence: Patient is encouraged to quit smoking and willing to attempt to quit was assessed. Patient highly motivated.Counseling was provided for 4 minutes.   DISCHARGE CONDITIONS AND DIET:   Stable Diabetic diet  CONSULTS OBTAINED:  Treatment Team:  Catarina Hartshorn, MD  DRUG ALLERGIES:  No Known Allergies  DISCHARGE MEDICATIONS:    Allergies as of 04/19/2018   No Known Allergies     Medication List    STOP taking these medications   naproxen 500 MG tablet Commonly known as:  NAPROSYN     TAKE these medications   amLODipine 5 MG tablet Commonly known as:  NORVASC Take 1 tablet (5 mg total) by mouth daily.   aspirin 81 MG EC tablet Take 1 tablet (81 mg total) by mouth daily.   blood glucose meter kit and supplies Kit Dispense based on patient and insurance preference. Use up to four times daily as directed. (FOR ICD-9 250.00, 250.01).   clopidogrel 75 MG tablet Commonly known as:  PLAVIX Take 1 tablet (75 mg total) by mouth daily.   insulin glargine 100 UNIT/ML injection Commonly known as:  LANTUS 10 units at night daily   nicotine 21 mg/24hr patch Commonly known as:  NICODERM CQ - dosed in mg/24 hours Place 1 patch (21 mg total) onto the skin daily. Start taking on:  April 20, 2018   rosuvastatin 40 MG tablet Commonly known as:  Crestor Take 1 tablet (40 mg total) by mouth at bedtime.         Today   CHIEF COMPLAINT:  Patient with persistent facial droop.  No other neurological deficits.   VITAL SIGNS:  Blood pressure (!) 147/74, pulse 66, temperature 98.3 F (36.8 C), temperature source Oral, resp. rate 18, height _0  (1.651 m), weight 72.3 kg, SpO2 96 %.   REVIEW OF SYSTEMS:  Review of Systems  Constitutional: Negative.  Negative for chills, fever and malaise/fatigue.  HENT: Negative.  Negative for ear discharge, ear pain, hearing loss, nosebleeds and sore throat.   Eyes: Negative.  Negative for blurred vision and pain.  Respiratory: Negative.  Negative for cough, hemoptysis, shortness of breath and wheezing.   Cardiovascular: Negative.  Negative for chest pain, palpitations and leg swelling.  Gastrointestinal: Negative.  Negative for abdominal pain, blood in stool, diarrhea, nausea and vomiting.  Genitourinary: Negative.  Negative for dysuria.  Musculoskeletal: Negative.   Negative for back pain.  Skin: Negative.   Neurological: Positive for sensory change. Negative for dizziness, tremors, speech change, focal weakness, seizures and headaches.  Endo/Heme/Allergies: Negative.  Does not bruise/bleed easily.  Psychiatric/Behavioral: Negative.  Negative for depression, hallucinations and suicidal ideas.     PHYSICAL EXAMINATION:  GENERAL:  63 y.o.-year-old patient lying in the bed with no acute distress.  NECK:  Supple, no jugular venous distention. No thyroid enlargement, no tenderness.  LUNGS: Normal breath sounds bilaterally, no wheezing, rales,rhonchi  No use of accessory muscles of respiration.  CARDIOVASCULAR: S1, S2 normal. No murmurs, rubs, or gallops.  ABDOMEN: Soft, non-tender, non-distended. Bowel sounds present. No organomegaly or mass.  EXTREMITIES: No pedal edema, cyanosis, or clubbing.  PSYCHIATRIC: The patient is alert and oriented x 3.  SKIN: No obvious rash, lesion, or ulcer.  Neuro: Left facial droop DATA REVIEW:   CBC Recent Labs  Lab 04/18/18 1115  WBC 8.3  HGB 15.7*  HCT 48.6*  PLT 273    Chemistries  Recent Labs  Lab 04/18/18 1115  NA 138  K 4.4  CL 100  CO2 24  GLUCOSE 273*  BUN 12  CREATININE 0.69  CALCIUM 9.1  AST 26  ALT 19  ALKPHOS 83  BILITOT 1.2    Cardiac Enzymes Recent Labs  Lab 04/18/18 1115  TROPONINI <0.03    Microbiology Results  _0 @  RADIOLOGY:  Mr Jodene Nam Head Wo Contrast  Result Date: 04/18/2018 CLINICAL DATA:  Left-sided numbness and right-sided facial droop over the last few days. Weakness and dizziness. EXAM: MRI HEAD WITHOUT CONTRAST MRA HEAD WITHOUT CONTRAST TECHNIQUE: Multiplanar, multiecho pulse sequences of the brain and surrounding structures were obtained without intravenous contrast. Angiographic images of the head were obtained using MRA technique without contrast. COMPARISON:  Head CT same day FINDINGS: MRI HEAD FINDINGS Brain: Diffusion imaging shows numerous acute  watershed infarctions affecting the white matter of the left hemisphere from the frontal through parietal regions. Few tiny foci punctate cortical infarction. No large confluent infarction. Elsewhere, there is chronic small vessel ischemic change in the right para median pons. No focal cerebellar insult. Cerebral hemispheres elsewhere show mild chronic small-vessel ischemic change of the white matter. No mass lesion, hemorrhage, hydrocephalus or extra-axial collection. Vascular: Major vessels at the base of the brain show flow. Skull and upper cervical spine: Negative Sinuses/Orbits: Clear/normal Other: None MRA HEAD FINDINGS Both internal carotid arteries are patent through the skull base and siphon regions. No siphon stenosis. On the right, the anterior and middle cerebral vessels are patent without proximal stenosis, aneurysm or vascular malformation. More distal branch vessels show some atherosclerotic irregularity. On the left, the anterior cerebral artery shows flow with a moderate stenosis at the A1 segment. The left M1 segment is patent. There is occlusion of the superior division M2 branch consistent with embolic occlusion. Both vertebral arteries are patent to the basilar. The right is dominant. No basilar stenosis. Posterior circulation branch vessels are patent. There are multiple stenoses throughout the PCA territories. 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 Small foci of  cortical infarction. No evidence of hemorrhage or swelling. Occlusion of the left M2 superior division consistent with embolic occlusion. Mild chronic small-vessel ischemic changes elsewhere of the cerebral hemispheric white matter. Additional distal vessel atherosclerotic irregularity throughout the intracranial branch vessels, particularly notable in the PCA branches. These results were called by telephone at the time of  interpretation on 04/18/2018 at 2:02 pm to Dr. Cinda Quest , who verbally acknowledged these results. Electronically Signed   By: Nelson Chimes M.D.   On: 04/18/2018 14:07   Mr Brain Wo Contrast  Result Date: 04/18/2018 CLINICAL DATA:  Left-sided numbness and right-sided facial droop over the last few days. Weakness and dizziness. EXAM: MRI HEAD WITHOUT CONTRAST MRA HEAD WITHOUT CONTRAST TECHNIQUE: Multiplanar, multiecho pulse sequences of the brain and surrounding structures were obtained without intravenous contrast. Angiographic images of the head were obtained using MRA technique without contrast. COMPARISON:  Head CT same day FINDINGS: MRI HEAD FINDINGS Brain: Diffusion imaging shows numerous acute watershed infarctions affecting the white matter of the left hemisphere from the frontal through parietal regions. Few tiny foci punctate cortical infarction. No large confluent infarction. Elsewhere, there is chronic small vessel ischemic change in the right para median pons. No focal cerebellar insult. Cerebral hemispheres elsewhere show mild chronic small-vessel ischemic change of the white matter. No mass lesion, hemorrhage, hydrocephalus or extra-axial collection. Vascular: Major vessels at the base of the brain show flow. Skull and upper cervical spine: Negative Sinuses/Orbits: Clear/normal Other: None MRA HEAD FINDINGS Both internal carotid arteries are patent through the skull base and siphon regions. No siphon stenosis. On the right, the anterior and middle cerebral vessels are patent without proximal stenosis, aneurysm or vascular malformation. More distal branch vessels show some atherosclerotic irregularity. On the left, the anterior cerebral artery shows flow with a moderate stenosis at the A1 segment. The left M1 segment is patent. There is occlusion of the superior division M2 branch consistent with embolic occlusion. Both vertebral arteries are patent to the basilar. The right is dominant. No basilar  stenosis. Posterior circulation branch vessels are patent. There are multiple stenoses throughout the PCA territories. 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 Small foci of cortical infarction. No evidence of hemorrhage or swelling. Occlusion of the left M2 superior division consistent with embolic occlusion. Mild chronic small-vessel ischemic changes elsewhere of the cerebral hemispheric white matter. Additional distal vessel atherosclerotic irregularity throughout the intracranial branch vessels, particularly notable in the PCA branches. These results were called by telephone at the time of interpretation on 04/18/2018 at 2:02 pm to Dr. Cinda Quest , who verbally acknowledged these results. Electronically Signed   By: Nelson Chimes M.D.   On: 04/18/2018 14:07   US Carotid Bilateral  Result Date: 04/18/2018 CLINICAL DATA:  Cerebral infarction. EXAM: BILATERAL CAROTID DUPLEX ULTRASOUND TECHNIQUE: Pearline Cables scale imaging, color Doppler and duplex ultrasound were performed of bilateral carotid and vertebral arteries in the neck. COMPARISON:  None. FINDINGS: Criteria: Quantification of carotid stenosis is based on velocity parameters that correlate the residual internal carotid diameter with NASCET-based stenosis levels, using the diameter of the distal internal carotid lumen as the denominator for stenosis measurement. The following velocity measurements were obtained: RIGHT ICA:  117/35 cm/sec CCA:  45/62 cm/sec SYSTOLIC ICA/CCA RATIO:  1.6 ECA:  60 cm/sec LEFT ICA:  105/32 cm/sec CCA:  56/38 cm/sec SYSTOLIC ICA/CCA RATIO:  1.3 ECA:  75 cm/sec RIGHT CAROTID ARTERY: Mild amount  of partially calcified plaque is present at the level of the distal common carotid artery, carotid bulb and proximal right ICA. Estimated right ICA stenosis is less than 50%. RIGHT VERTEBRAL ARTERY: Antegrade flow with normal waveform and  velocity. LEFT CAROTID ARTERY: Mild amount of predominately calcified plaque is present at the level of the left carotid bulb and proximal left ICA. Estimated left ICA stenosis is less than 50%. LEFT VERTEBRAL ARTERY: Antegrade flow with normal waveform and velocity. IMPRESSION: Mild amount of plaque at both carotid bulbs and proximal internal carotid arteries. Estimated bilateral ICA stenoses are less than 50%. Electronically Signed   By: Aletta Edouard M.D.   On: 04/18/2018 14:33   Ct Head Code Stroke Wo Contrast  Result Date: 04/18/2018 CLINICAL DATA:  Code stroke.  Right facial droop EXAM: CT HEAD WITHOUT CONTRAST TECHNIQUE: Contiguous axial images were obtained from the base of the skull through the vertex without intravenous contrast. COMPARISON:  None. FINDINGS: Brain: Hypodensity left frontal white matter compatible with infarct of indeterminate age. Possible recent infarct. Right frontal periventricular white matter most consistent with chronic ischemia. Negative for hemorrhage or mass. Ventricle size normal without midline shift. Vascular: Negative for hyperdense vessel Skull: Negative Sinuses/Orbits: Negative Other: None ASPECTS ([place] Stroke Program Early CT Score) - Ganglionic level infarction (caudate, lentiform nuclei, internal capsule, insula, M1-M3 cortex): 7 - Supraganglionic infarction (M4-M6 cortex): 3 Total score (0-10 with 10 being normal): 10 IMPRESSION: 1. Hypodensity left frontal white matter compatible with ischemia of indeterminate age. Negative for hemorrhage 2. ASPECTS is 10 3. These results were called by telephone at the time of interpretation on 04/18/2018 at 11:22 am to Dr. Conni Slipper , who verbally acknowledged these results. Electronically Signed   By: Franchot Gallo M.D.   On: 04/18/2018 11:23      Allergies as of 04/19/2018   No Known Allergies     Medication List    STOP taking these medications   naproxen 500 MG tablet Commonly known as:  NAPROSYN      TAKE these medications   amLODipine 5 MG tablet Commonly known as:  NORVASC Take 1 tablet (5 mg total) by mouth daily.   aspirin 81 MG EC tablet Take 1 tablet (81 mg total) by mouth daily.   blood glucose meter kit and supplies Kit Dispense based on patient and insurance preference. Use up to four times daily as directed. (FOR ICD-9 250.00, 250.01).   clopidogrel 75 MG tablet Commonly known as:  PLAVIX Take 1 tablet (75 mg total) by mouth daily.   insulin glargine 100 UNIT/ML injection Commonly known as:  LANTUS 10 units at night daily   nicotine 21 mg/24hr patch Commonly known as:  NICODERM CQ - dosed in mg/24 hours Place 1 patch (21 mg total) onto the skin daily. Start taking on:  April 20, 2018   rosuvastatin 40 MG tablet Commonly known as:  Crestor Take 1 tablet (40 mg total) by mouth at bedtime.           Management plans discussed with the patient and she is in agreement. Stable for discharge   [facility]  CODE STATUS:     Code Status Orders  (From admission, onward)         Start     Ordered   04/18/18 1446  Full code  Continuous     04/18/18 1445        Code Status History    This patient  has a current code status but no historical code status.      TOTAL TIME TAKING CARE OF THIS PATIENT: 39 minutes.    Note: This dictation was prepared with Dragon dictation along with smaller phrase technology. Any transcriptional errors that result from this process are unintentional.  Bettey Costa M.D on 04/19/2018 at 10:51 AM  Between 7am to 6pm - Pager - [phone] After 6pm go to [email or web address] - password [email or web address] [facility]  Office  [phone]  CC: Primary care physician; Patient, No Pcp Per

## 2018-04-19 NOTE — Progress Notes (Signed)
Physical Therapy Treatment Patient Details Name: Brandi Rojas MRN: [id number] DOB: January 26, 1955 Today's Date: 04/19/2018    History of Present Illness 63 y.o. female no significant past medical history presented to the emergency room with right facial droop and left arm and left leg numbness and weakness, slurred speech, which started few days ago no evidence of any seizure-like activity. Patient felt lightheaded and dizzy. She was worked up with CT head which showed hypodensities in the left frontal white matter of indeterminate age.  No evidence of hemorrage.    PT Comments    Pt agreeable to PT; denies pain. Pt demonstrates Mod I with bed mobility. STS transfers several times with Min guard for safety due to mild incoordination; good use of hands. Ambulates with Min guard for safety and mild balance deficits without AD in room; no over LOB. Pt does not wish or feel need for any AD at home. Pt performs stand unilateral exercises bilaterally and BLE exercises with BUE support.  Currently, pt has discharge to home to continue progress with home health services.   Follow Up Recommendations  Home health PT;Supervision for mobility/OOB     Equipment Recommendations  None recommended by PT;Other (comment)(does not wish any equipment)    Recommendations for Other Services       Precautions / Restrictions Precautions Precautions: Fall Restrictions Weight Bearing Restrictions: No    Mobility  Bed Mobility Overal bed mobility: Modified Independent       Supine to sit: Modified independent (Device/Increase time)     General bed mobility comments: use of rails and mild increased time  Transfers Overall transfer level: Needs assistance Equipment used: None Transfers: Sit to/from Stand Sit to Stand: Min guard         General transfer comment: mild uncoordiination; no overt LOB  Ambulation/Gait Ambulation/Gait assistance: Min guard Gait Distance (Feet): 50 Feet(in  room) Assistive device: None       General Gait Details: Mild uncoordination; no overt LOB; min guard for safety. HR 80 bpm   Stairs             Wheelchair Mobility    Modified Rankin (Stroke Patients Only)       Balance Overall balance assessment: Needs assistance Sitting-balance support: Feet supported;Bilateral upper extremity supported Sitting balance-Leahy Scale: Good     Standing balance support: No upper extremity supported Standing balance-Leahy Scale: Fair                              Cognition Arousal/Alertness: Awake/alert Behavior During Therapy: WFL for tasks assessed/performed;Impulsive;Flat affect Overall Cognitive Status: Within Functional Limits for tasks assessed                                        Exercises General Exercises - Lower Extremity Hip ABduction/ADduction: Both;10 reps;Standing;Strengthening Straight Leg Raises: Strengthening;Both;10 reps;Standing Hip Flexion/Marching: Strengthening;Both;10 reps;Standing(2 sets) Heel Raises: Strengthening;10 reps;Standing(needs increased UE support) Mini-Sqauts: Strengthening;10 reps    General Comments        Pertinent Vitals/Pain Pain Assessment: No/denies pain    Home Living                      Prior Function            PT Goals (current goals can now be found in the care plan section) Progress towards PT  goals: Progressing toward goals    Frequency    7X/week      PT Plan Current plan remains appropriate    Co-evaluation              AM-PAC PT "6 Clicks" Mobility   Outcome Measure  Help needed turning from your back to your side while in a flat bed without using bedrails?: None Help needed moving from lying on your back to sitting on the side of a flat bed without using bedrails?: A Little Help needed moving to and from a bed to a chair (including a wheelchair)?: A Little Help needed standing up from a chair using your  arms (e.g., wheelchair or bedside chair)?: A Little Help needed to walk in hospital room?: A Little Help needed climbing 3-5 steps with a railing? : A Little 6 Click Score: 19    End of Session Equipment Utilized During Treatment: Gait belt Activity Tolerance: Patient tolerated treatment well Patient left: in chair;with call bell/phone within reach;with chair alarm set   PT Visit Diagnosis: Unsteadiness on feet (R26.81);Other abnormalities of gait and mobility (R26.89);Difficulty in walking, not elsewhere classified (R26.2);Muscle weakness (generalized) (M62.81)     Time: 3009-2330 PT Time Calculation (min) (ACUTE ONLY): 29 min  Charges:  $Gait Training: 8-22 mins $Therapeutic Exercise: 8-22 mins                       Scot Dock, PTA 04/19/2018, 11:50 AM

## 2018-04-19 NOTE — Progress Notes (Signed)
Pt has been discharged home. Discharge papers given and explained to pt. Pt verbalized understanding. Meds and f/u appointments reviewed. Rx given. 

## 2018-04-20 NOTE — Care Management (Signed)
RNCM received call from patients daughter stating that Ms. Coopman has been unsuccessful in using MATCH card at two different pharmacies. She is currently at [facility] on [address]. I spoke directly with pharmacy employee, Mel Almond. We confirmed all of the numbers on the [facility] card and per [facility] in the system it says"that the card cannot be used for insulin and that can only be filled at a [facility] affiliated pharmacy". Patient was able to get all other medications filled with [facility] card. I spoke with Arminda Resides, patients daughter and their plan is to go to Medication Management clinic and fill insulin medication as it is $315 out of pocket at [facility]. I provided Crystal with the address and phone number for Medication management. [facility] will [facility] script to them and they confirmed their plan to take it to [facility] tomorrow.   Buddy Duty RN BSN RNCM [phone]

## 2018-05-23 DIAGNOSIS — Z720 Tobacco use: Secondary | ICD-10-CM | POA: Insufficient documentation

## 2018-05-23 DIAGNOSIS — I10 Essential (primary) hypertension: Secondary | ICD-10-CM | POA: Diagnosis present

## 2018-05-23 DIAGNOSIS — E785 Hyperlipidemia, unspecified: Secondary | ICD-10-CM | POA: Diagnosis present

## 2018-07-06 ENCOUNTER — Encounter: Payer: Self-pay | Admitting: Intensive Care

## 2018-07-06 ENCOUNTER — Emergency Department: Payer: BLUE CROSS/BLUE SHIELD

## 2018-07-06 ENCOUNTER — Emergency Department
Admission: EM | Admit: 2018-07-06 | Discharge: 2018-07-06 | Disposition: A | Discharge status: Home / Self Care | Payer: BLUE CROSS/BLUE SHIELD | Attending: Emergency Medicine | Admitting: Emergency Medicine

## 2018-07-06 ENCOUNTER — Other Ambulatory Visit: Payer: Self-pay

## 2018-07-06 DIAGNOSIS — I10 Essential (primary) hypertension: Secondary | ICD-10-CM | POA: Insufficient documentation

## 2018-07-06 DIAGNOSIS — Z79899 Other long term (current) drug therapy: Secondary | ICD-10-CM | POA: Insufficient documentation

## 2018-07-06 DIAGNOSIS — Z794 Long term (current) use of insulin: Secondary | ICD-10-CM | POA: Diagnosis not present

## 2018-07-06 DIAGNOSIS — Z87891 Personal history of nicotine dependence: Secondary | ICD-10-CM | POA: Insufficient documentation

## 2018-07-06 DIAGNOSIS — M19011 Primary osteoarthritis, right shoulder: Secondary | ICD-10-CM | POA: Insufficient documentation

## 2018-07-06 DIAGNOSIS — I69951 Hemiplegia and hemiparesis following unspecified cerebrovascular disease affecting right dominant side: Secondary | ICD-10-CM | POA: Diagnosis not present

## 2018-07-06 DIAGNOSIS — M79601 Pain in right arm: Secondary | ICD-10-CM | POA: Diagnosis present

## 2018-07-06 DIAGNOSIS — Z7982 Long term (current) use of aspirin: Secondary | ICD-10-CM | POA: Diagnosis not present

## 2018-07-06 DIAGNOSIS — E119 Type 2 diabetes mellitus without complications: Secondary | ICD-10-CM | POA: Diagnosis not present

## 2018-07-06 DIAGNOSIS — M79603 Pain in arm, unspecified: Secondary | ICD-10-CM

## 2018-07-06 HISTORY — DX: Type 2 diabetes mellitus without complications: E11.9

## 2018-07-06 HISTORY — DX: Cerebral infarction, unspecified: I63.9

## 2018-07-06 HISTORY — DX: Essential (primary) hypertension: I10

## 2018-07-06 LAB — CBC WITH DIFFERENTIAL/PLATELET
Abs Immature Granulocytes: 0.02 10*3/uL (ref 0.00–0.07)
Basophils Absolute: 0 10*3/uL (ref 0.0–0.1)
Basophils Relative: 0 %
Eosinophils Absolute: 0.3 10*3/uL (ref 0.0–0.5)
Eosinophils Relative: 3 %
HCT: 40.2 % (ref 36.0–46.0)
Hemoglobin: 12.6 g/dL (ref 12.0–15.0)
Immature Granulocytes: 0 %
Lymphocytes Relative: 27 %
Lymphs Abs: 2.8 10*3/uL (ref 0.7–4.0)
MCH: 26.1 pg (ref 26.0–34.0)
MCHC: 31.3 g/dL (ref 30.0–36.0)
MCV: 83.4 fL (ref 80.0–100.0)
Monocytes Absolute: 0.5 10*3/uL (ref 0.1–1.0)
Monocytes Relative: 5 %
Neutro Abs: 6.8 10*3/uL (ref 1.7–7.7)
Neutrophils Relative %: 65 %
Platelets: 326 10*3/uL (ref 150–400)
RBC: 4.82 MIL/uL (ref 3.87–5.11)
RDW: 12.7 % (ref 11.5–15.5)
WBC: 10.5 10*3/uL (ref 4.0–10.5)
nRBC: 0 % (ref 0.0–0.2)

## 2018-07-06 LAB — PROTIME-INR
INR: 1 (ref 0.8–1.2)
Prothrombin Time: 12.8 seconds (ref 11.4–15.2)

## 2018-07-06 LAB — BASIC METABOLIC PANEL
Anion gap: 10 (ref 5–15)
BUN: 16 mg/dL (ref 8–23)
CO2: 25 mmol/L (ref 22–32)
Calcium: 8.9 mg/dL (ref 8.9–10.3)
Chloride: 106 mmol/L (ref 98–111)
Creatinine, Ser: 0.64 mg/dL (ref 0.44–1.00)
GFR calc Af Amer: >60 mL/min (ref >60)
GFR calc non Af Amer: >60 mL/min (ref >60)
Glucose, Bld: 228 mg/dL — ABNORMAL HIGH (ref 70–99)
Potassium: 4 mmol/L (ref 3.5–5.1)
Sodium: 141 mmol/L (ref 135–145)

## 2018-07-06 LAB — TROPONIN I: Troponin I: <0.03 ng/mL (ref <0.03)

## 2018-07-06 LAB — GLUCOSE, CAPILLARY: Glucose-Capillary: 247 mg/dL — ABNORMAL HIGH (ref 70–99)

## 2018-07-06 LAB — APTT: aPTT: 27 seconds (ref 24–36)

## 2018-07-06 IMAGING — CR RIGHT SHOULDER - 2+ VIEW
1 series · 3 of 3 positions shown · non-contrast
Comparison: None.

CLINICAL DATA: Right arm pain

EXAM:
RIGHT SHOULDER - 2+ VIEW

[Series 1: dg shoulder right · 0.14mm/px · 3 of 3 slices shown]
[im 1/3]
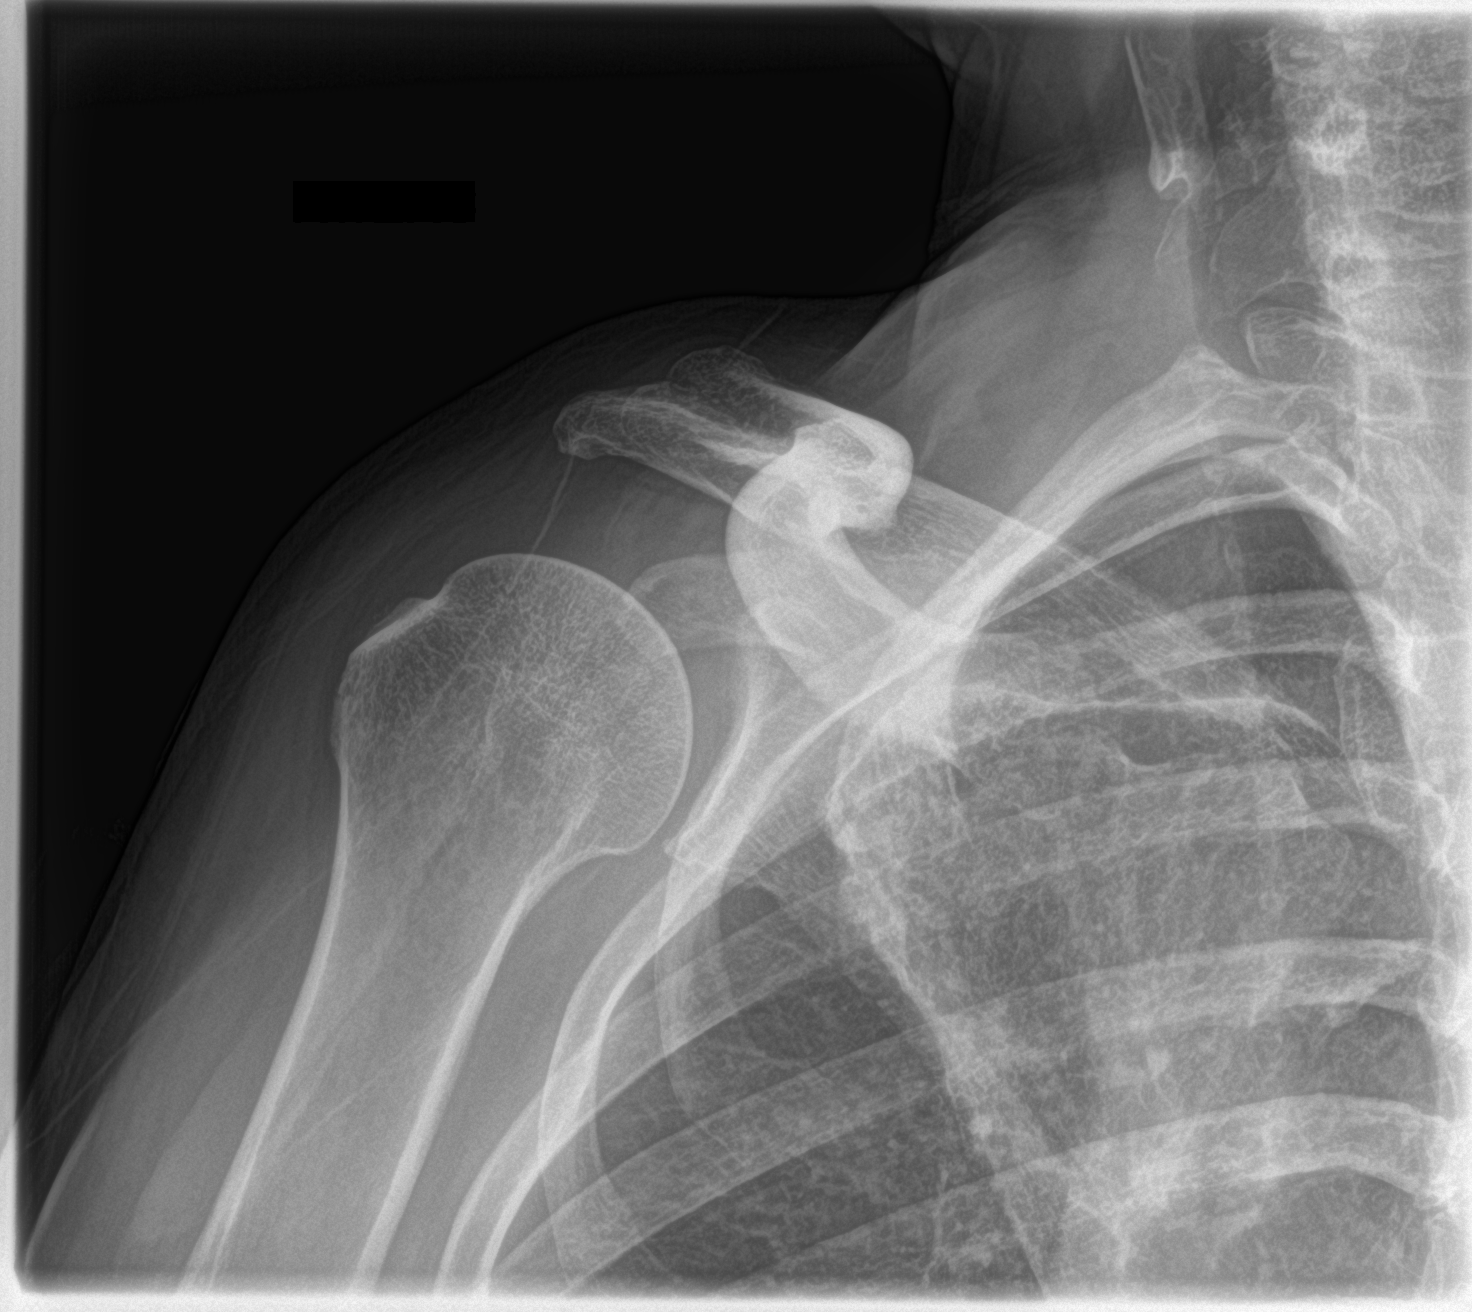
[im 2/3]
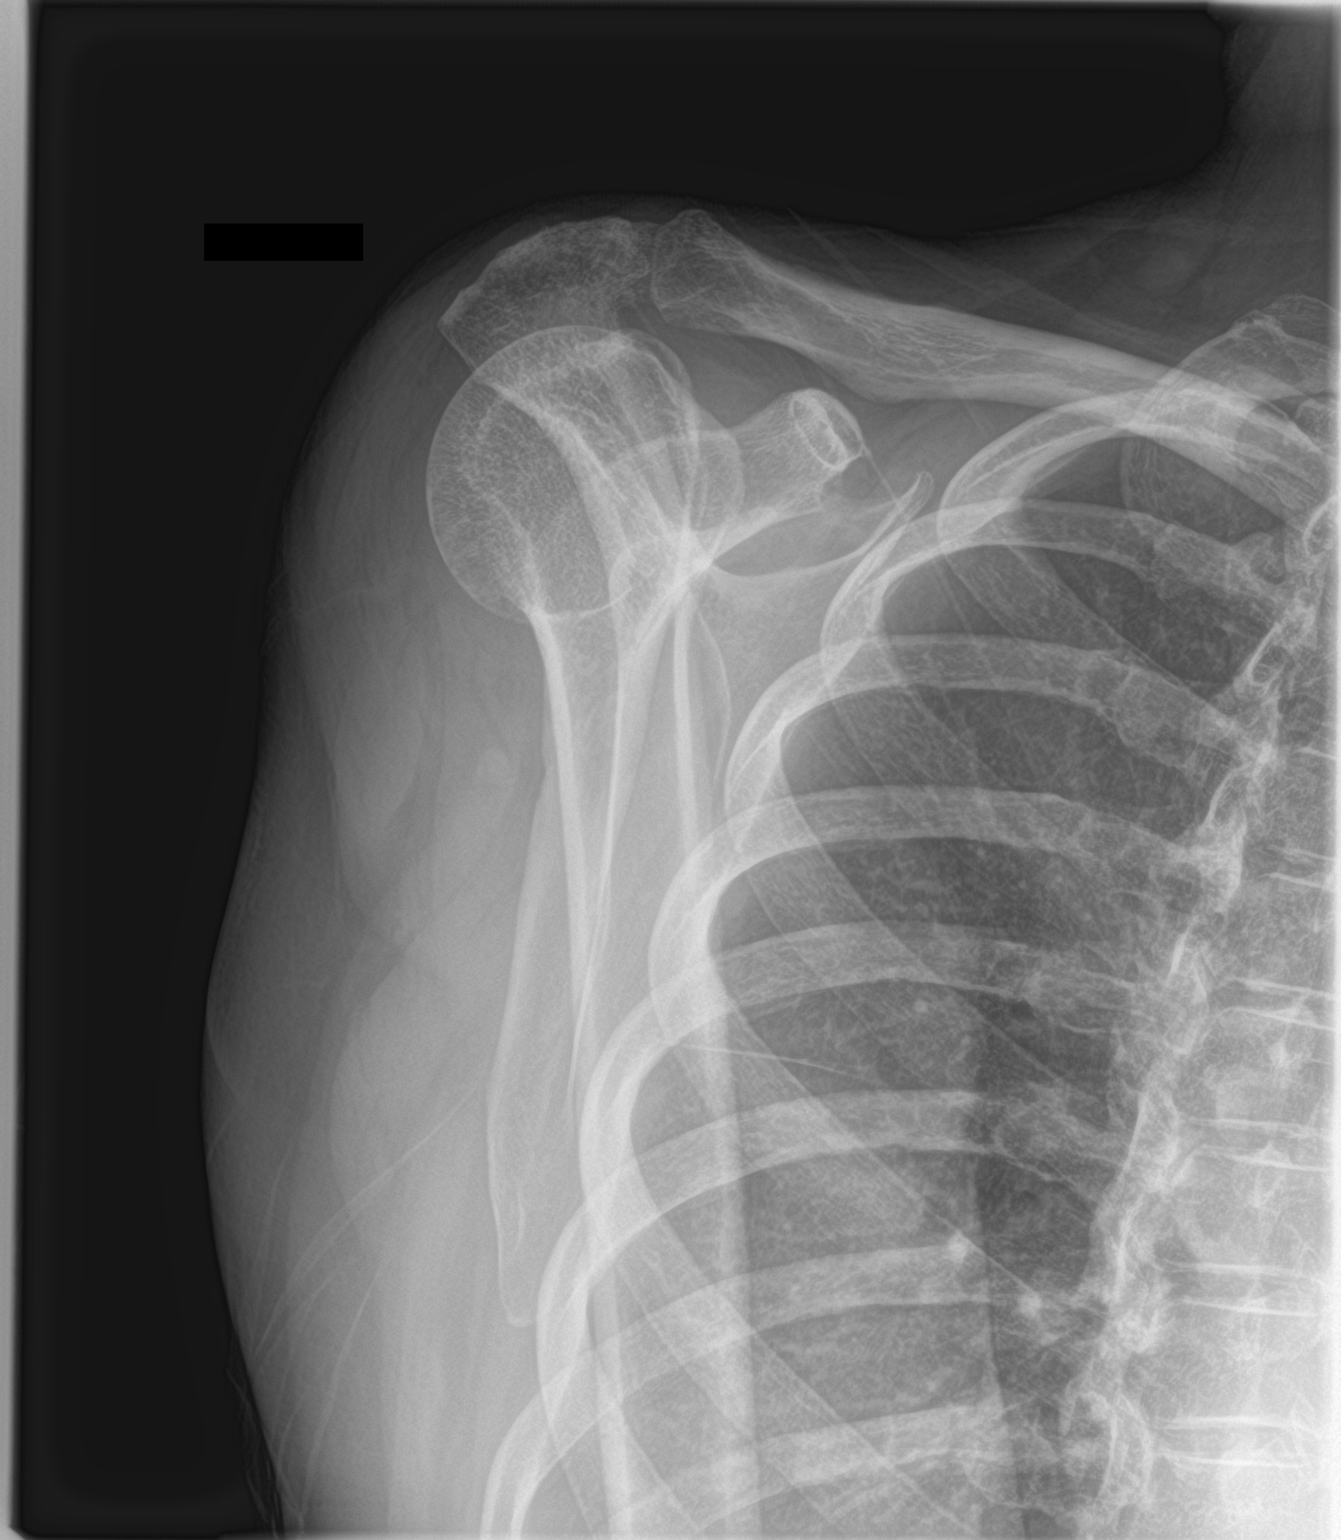
[im 3/3]
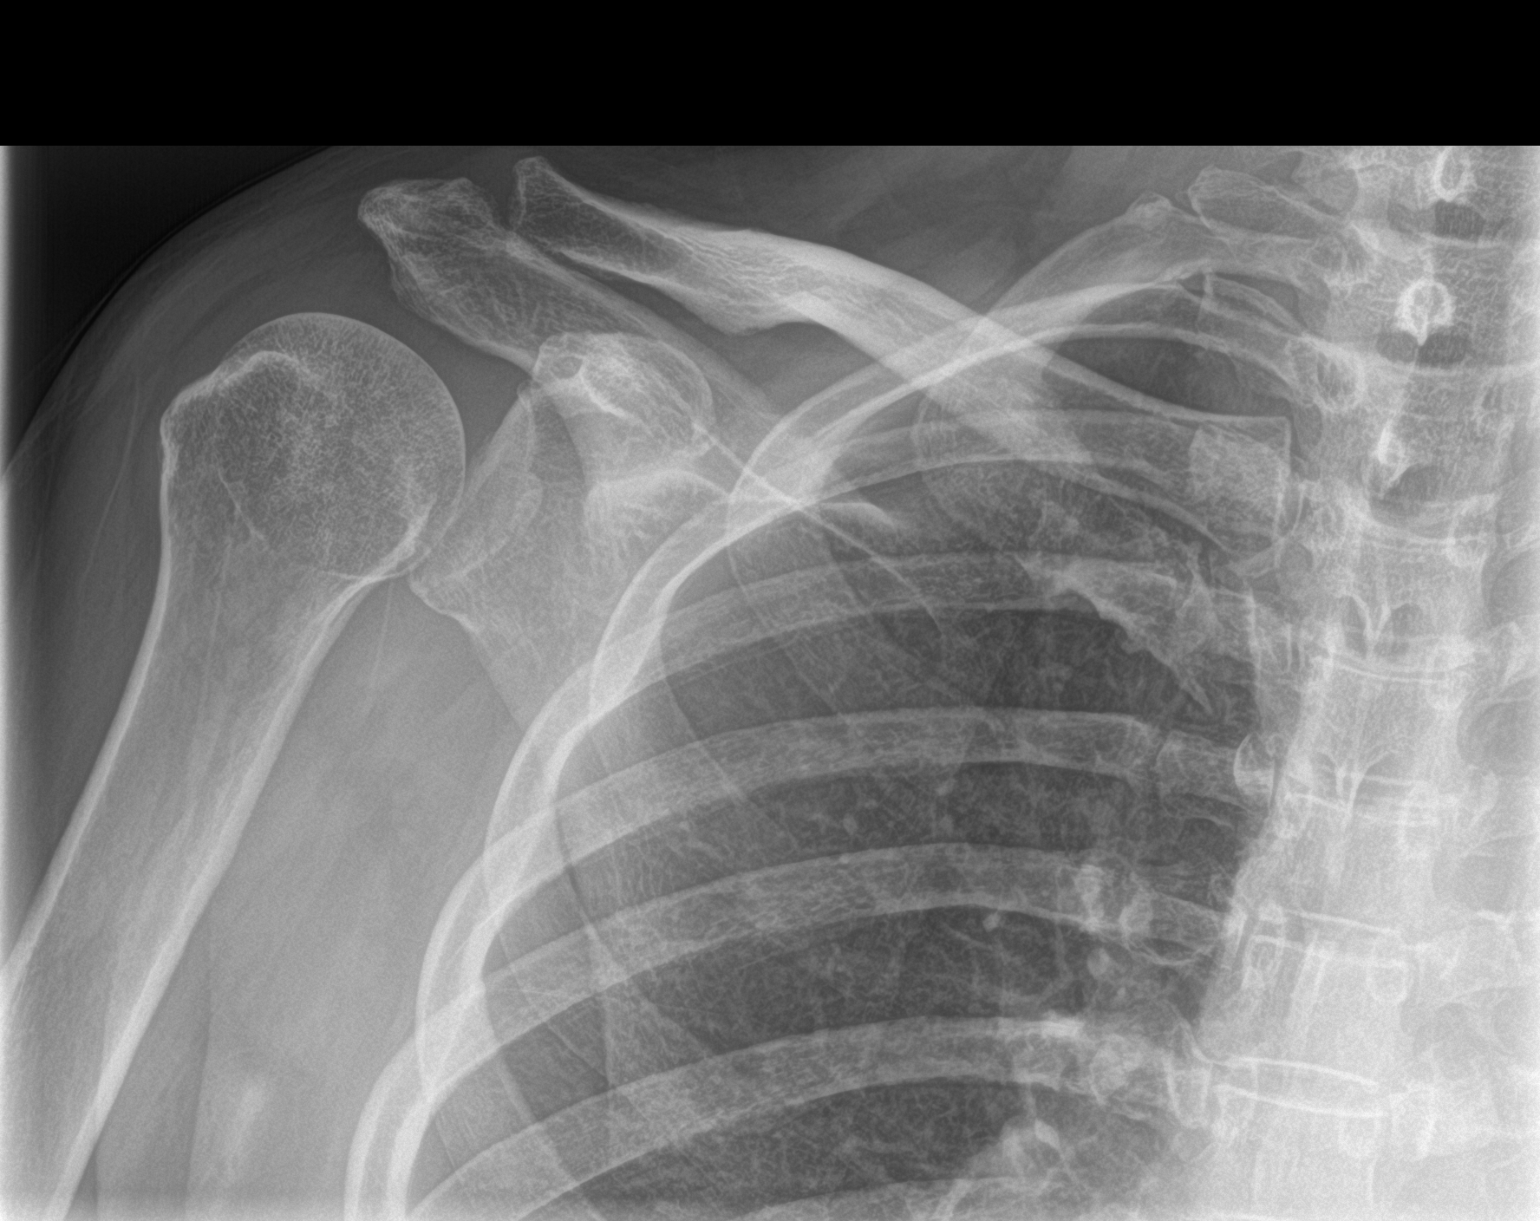

[3 of 3 positions shown; findings below may reference images not displayed]

FINDINGS: Degenerative changes in the AC joint with joint space narrowing and
spurring. Glenohumeral joint is maintained. No acute bony
abnormality. Specifically, no fracture, subluxation, or dislocation.
Soft tissues are intact.
IMPRESSION: Degenerative changes in the right AC joint. No acute bony
abnormality.

## 2018-07-06 IMAGING — CR CERVICAL SPINE - 2-3 VIEW
1 series · 5 of 5 positions shown · non-contrast
Comparison: None.

CLINICAL DATA: Right arm pain, right arm paralysis.

EXAM:
CERVICAL SPINE - 2-3 VIEW

[Series 1: dg cervical spine 2 or 3 views · 0.14mm/px · 5 of 5 slices shown]
[im 1/5]
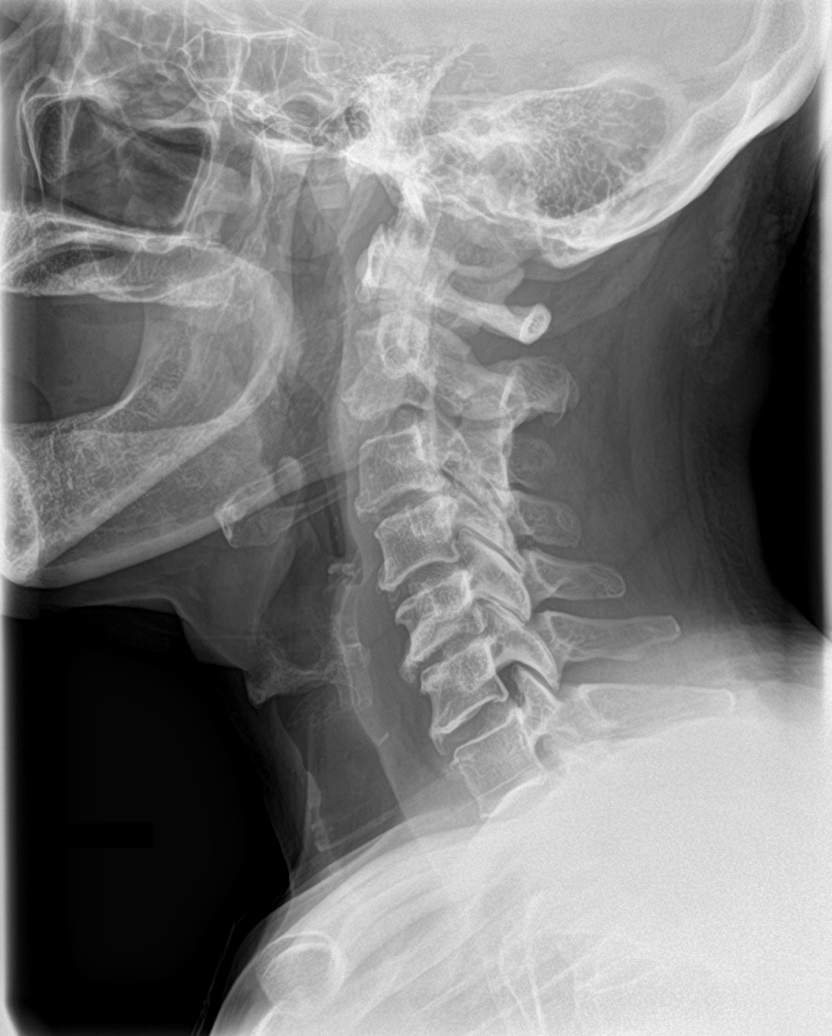
[im 2/5]
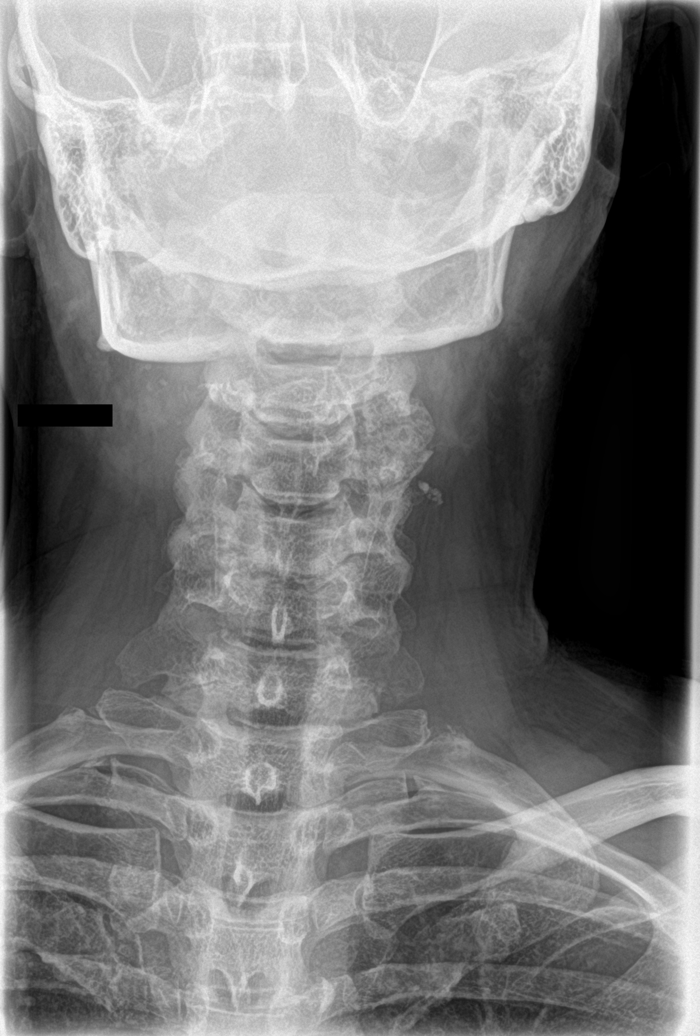
[im 3/5]
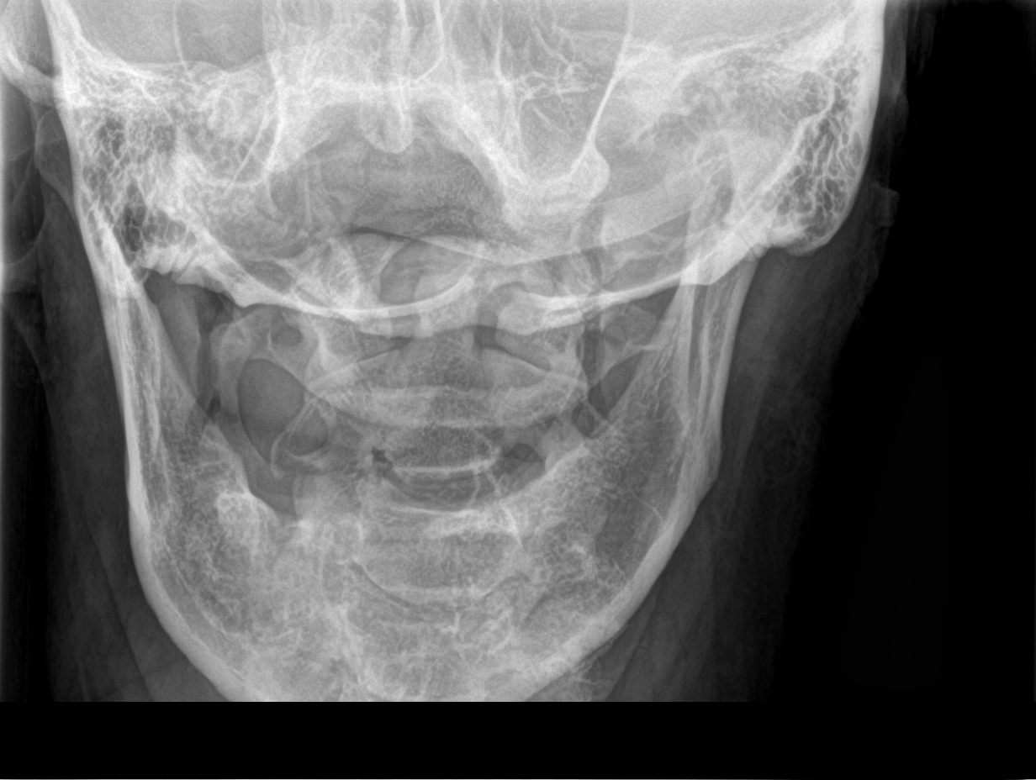
[im 4/5]
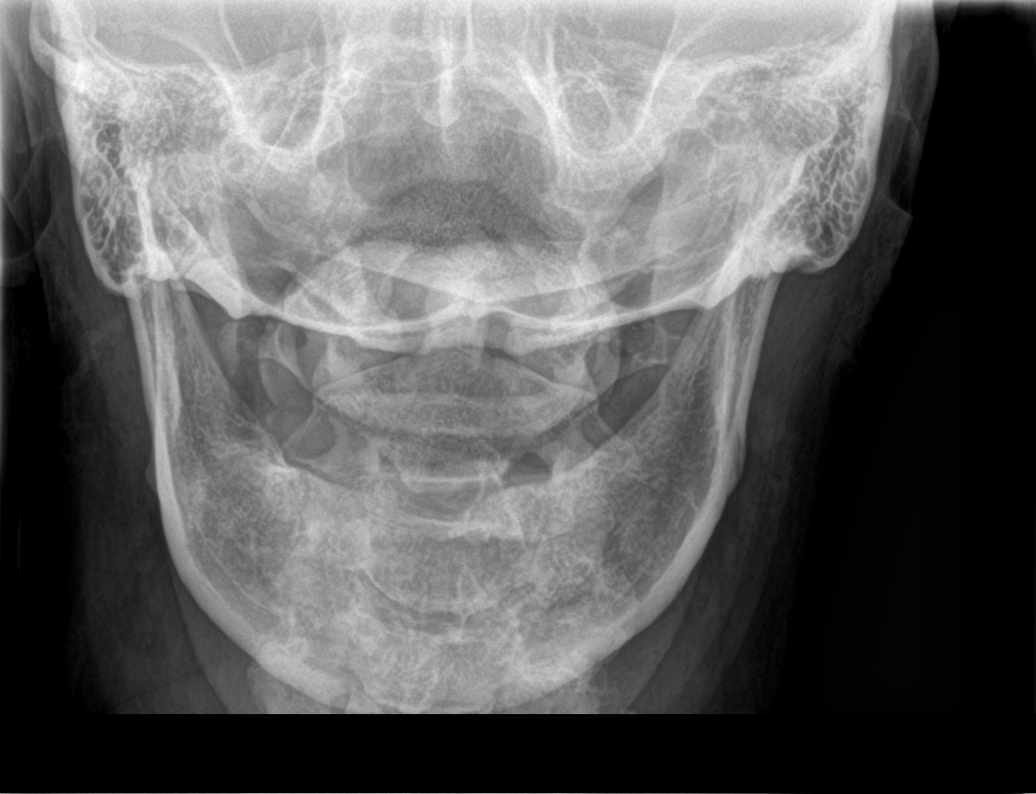
[im 5/5]
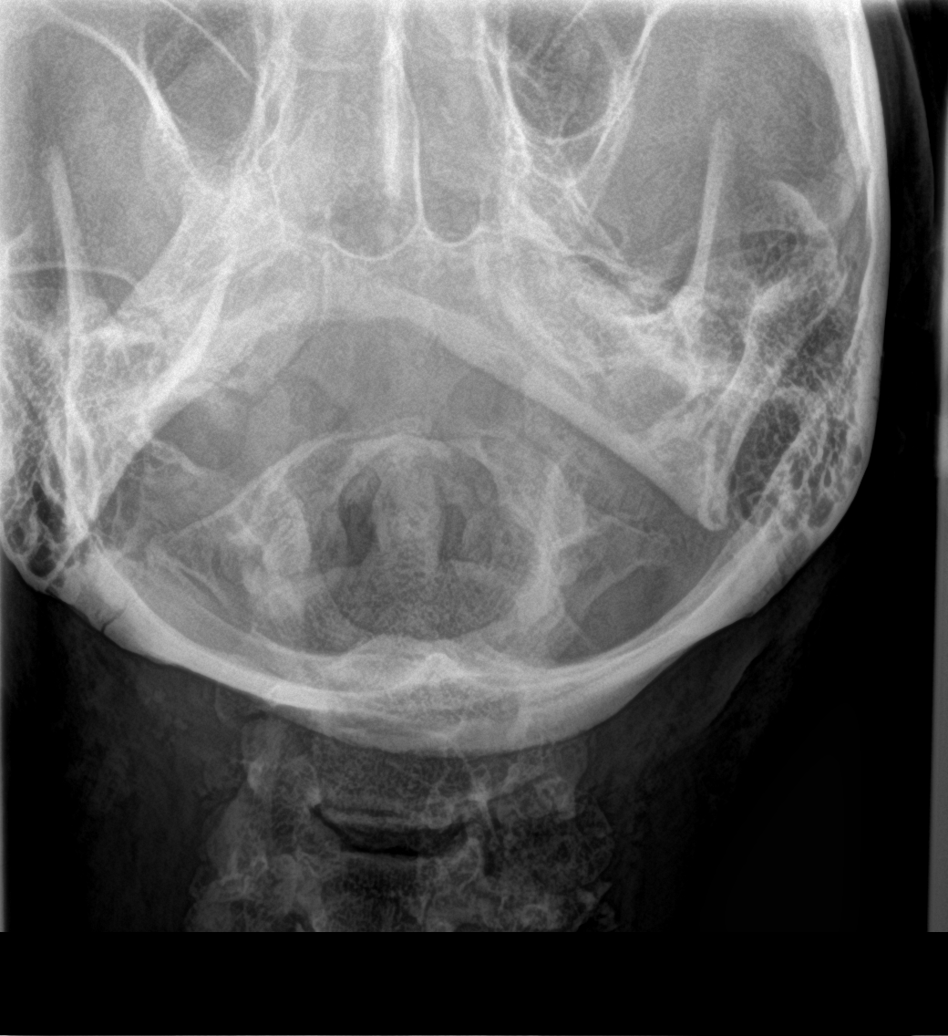

[5 of 5 positions shown; findings below may reference images not displayed]

FINDINGS: Disc space narrowing and spurring throughout the cervical spine,
most pronounced at C5-6. Diffuse bilateral degenerative facet
disease. No fracture or subluxation. Prevertebral soft tissues are
normal.
IMPRESSION: Degenerative disc and facet disease.  No acute bony abnormality.

## 2018-07-06 IMAGING — CT CT HEAD WITHOUT CONTRAST
3 series · 15 of 44 positions shown, 18 images · non-contrast
Comparison: [DATE] CT head and MR brain exams

CLINICAL DATA: RIGHT arm pain aching since this morning, recent
stroke in [DATE] with residual RIGHT arm paralysis, deficits to
RIGHT leg, and aphasia; past history hypertension, diabetes
mellitus, former smoker

EXAM:
CT HEAD WITHOUT CONTRAST
TECHNIQUE: Contiguous axial images were obtained from the base of the skull
through the vertex without intravenous contrast. Sagittal and
coronal MPR images reconstructed from axial data set.

[Series 2: head wo · axial · 0.47mm/px · z∈[-152,-42]mm · 9 of 27 slices shown, 12 images]
[im 3/27  brain]
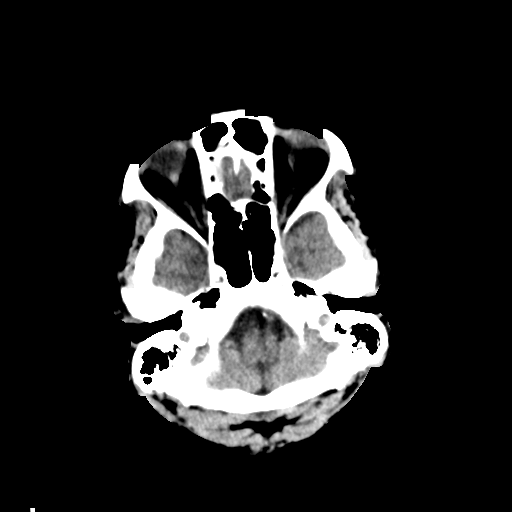
[im 3/27  bone]
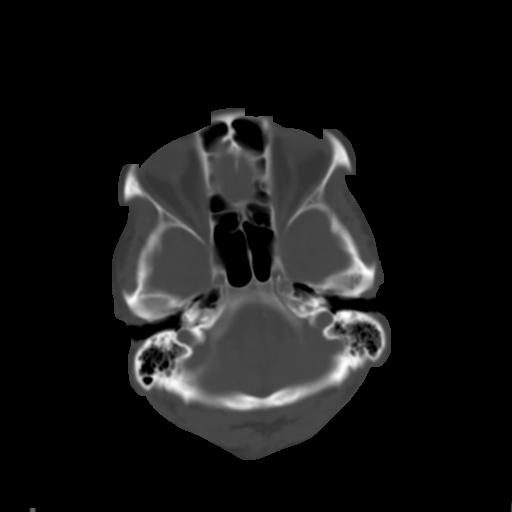
[im 6/27  brain]
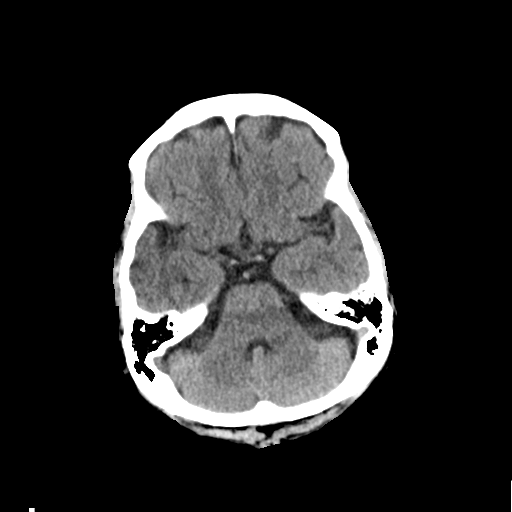
[im 8/27  brain]
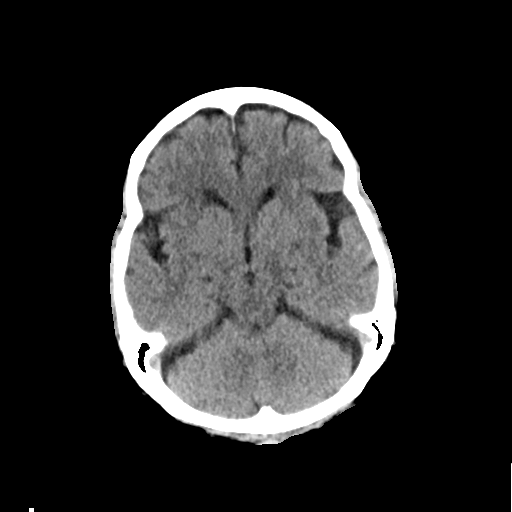
[im 11/27  brain]
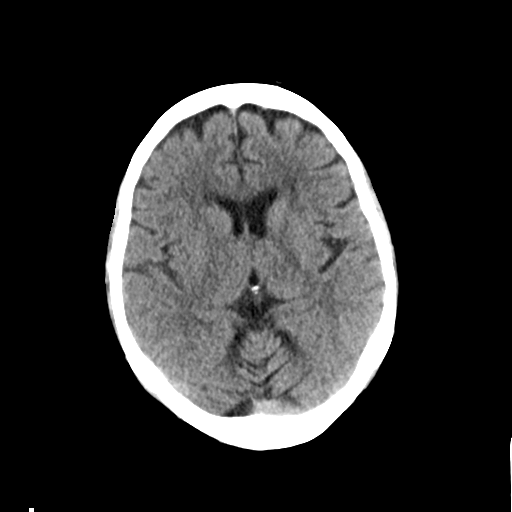
[im 14/27  brain]
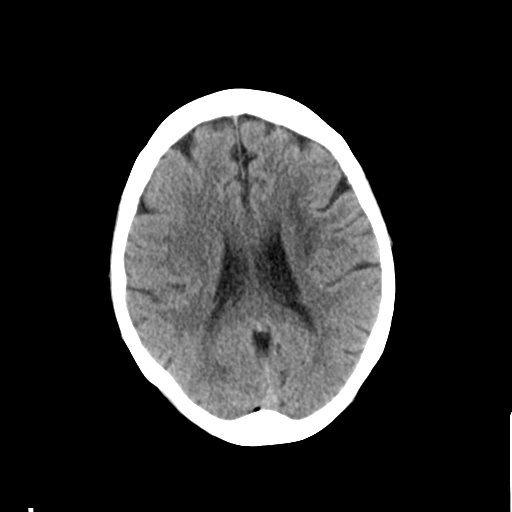
[im 14/27  bone]
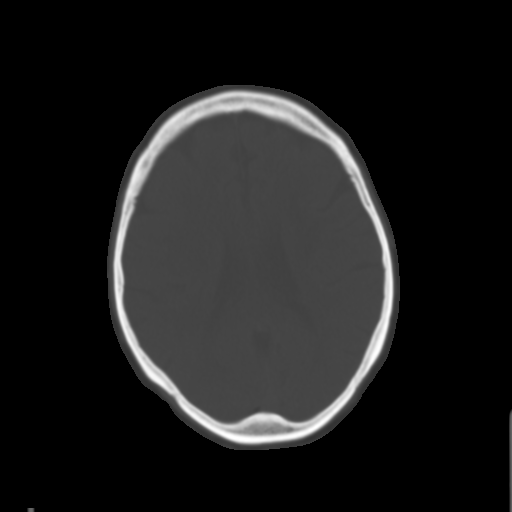
[im 17/27  brain]
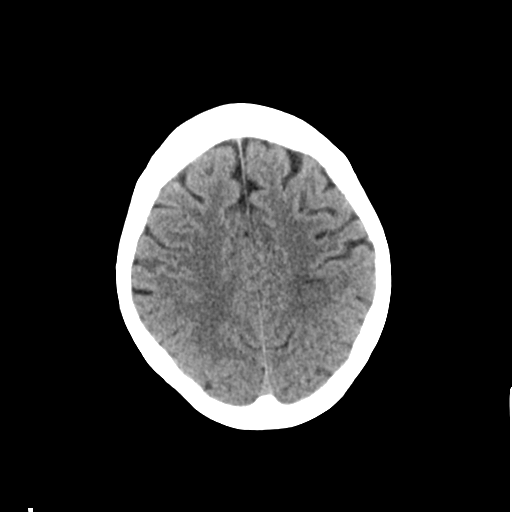
[im 20/27  brain]
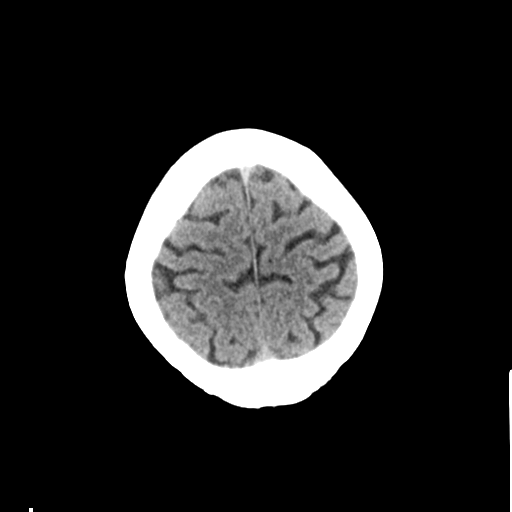
[im 22/27  brain]
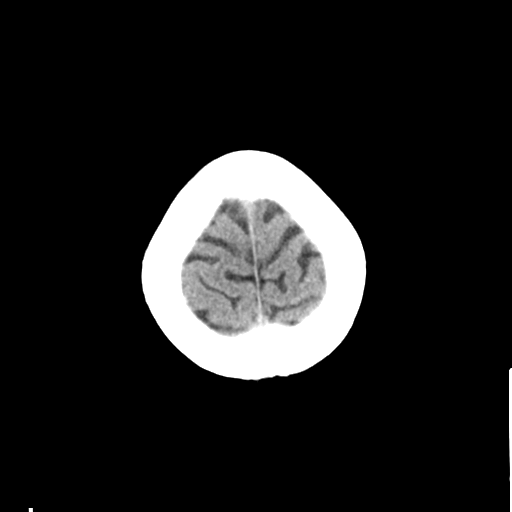
[im 25/27  brain]
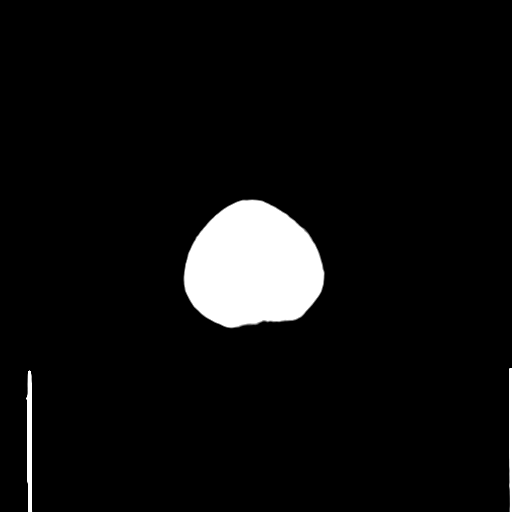
[im 25/27  bone]
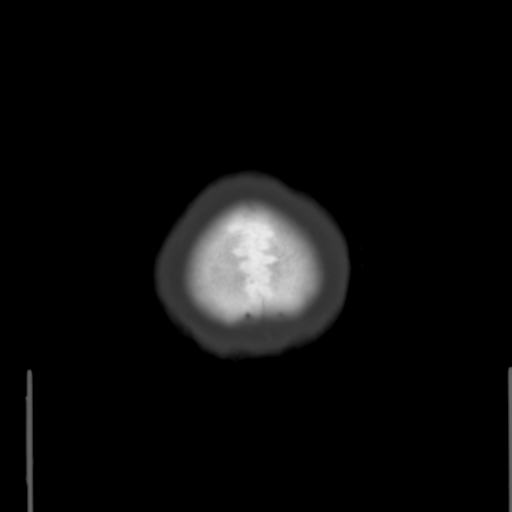

[Series 4: coronal soft tissue · coronal · 0.28mm/px · 3 of 64 slices shown]
[im 22/64  brain]
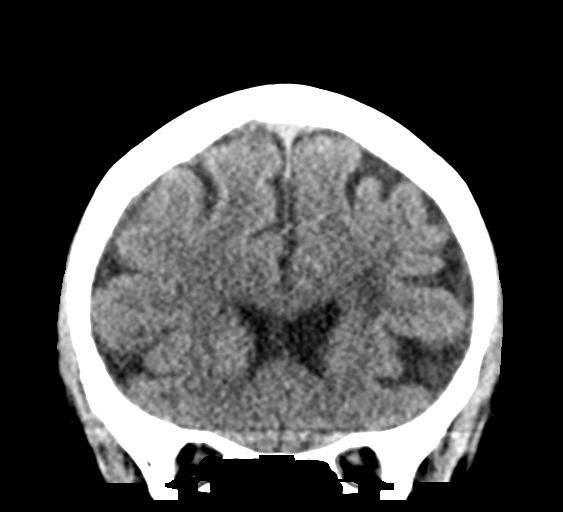
[im 29/64  brain]
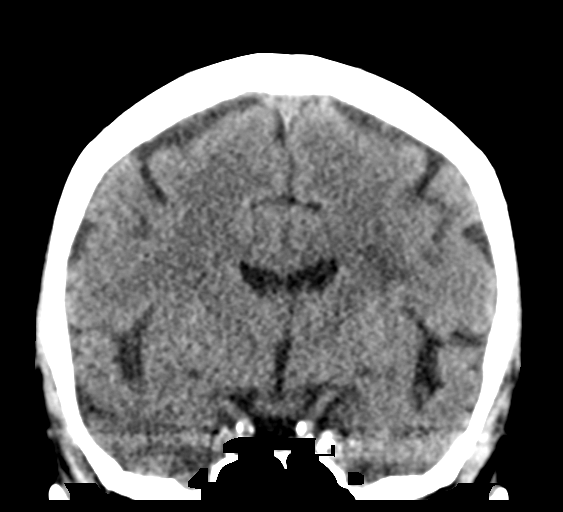
[im 36/64  brain]
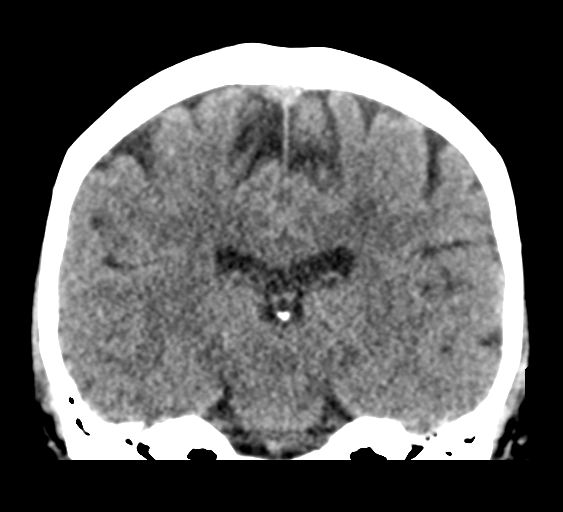

[Series 5: sagittal soft tissue · sagittal · 0.28mm/px · 3 of 52 slices shown]
[im 18/52  brain]
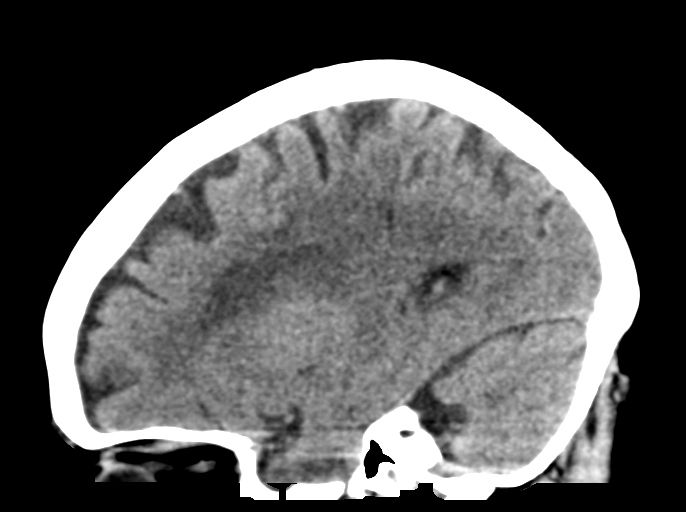
[im 26/52  brain]
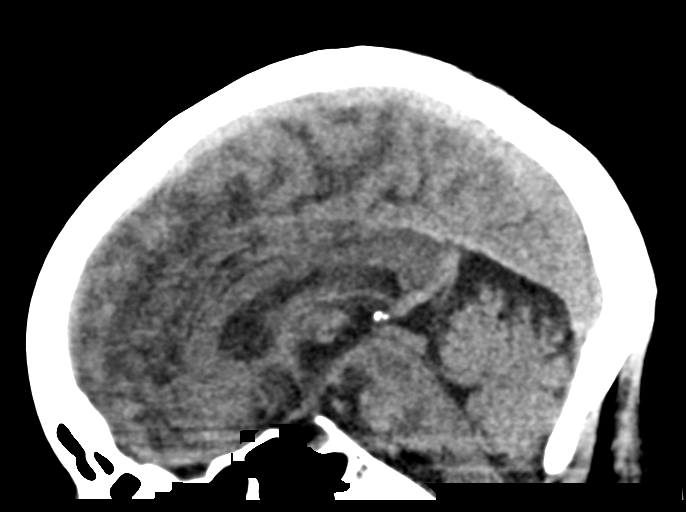
[im 35/52  brain]
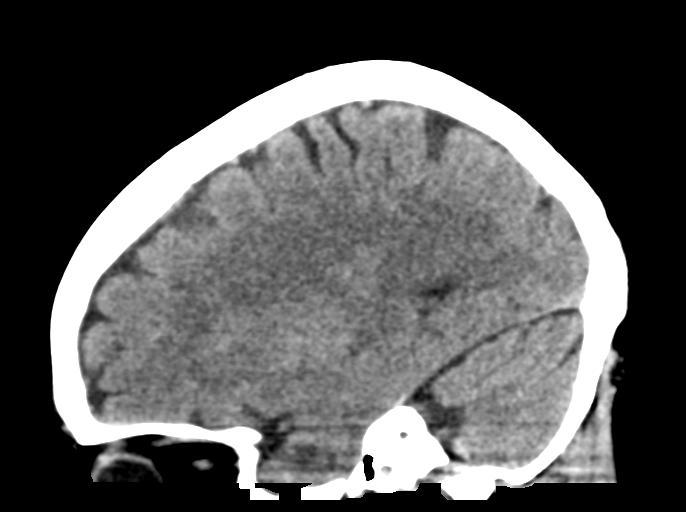

[15 of 44 positions shown; findings below may reference images not displayed]

FINDINGS: Brain: Mild atrophy. Normal ventricular morphology. No midline shift
or mass effect. Scattered mild small vessel chronic ischemic changes
of deep cerebral white matter. More focal hypodensities identified
within deep LEFT frontal white matter consistent with infarct seen
in [REDACTED]. Probable periventricular white matter infarct RIGHT
frontal region. No intracranial hemorrhage, mass lesion or evidence
of acute infarction. No extra-axial fluid collections.

Vascular: Unremarkable

Skull: Normal appearance

Sinuses/Orbits: Clear

Other: N/A
IMPRESSION: Atrophy with small vessel chronic ischemic changes of deep cerebral
white matter.

Old white matter infarcts in the frontal lobes larger on LEFT.

No acute intracranial abnormalities.

## 2018-07-06 IMAGING — US RIGHT UPPER EXTREMITY VENOUS ULTRASOUND
2 series · 13 of 24 positions shown · non-contrast
Comparison: None.

CLINICAL DATA: Right arm pain for 8 hours



[Series 1: right upper extremity venous ultrasound · 12 of 32 slices shown (1 of 2)]
[im 1/32]
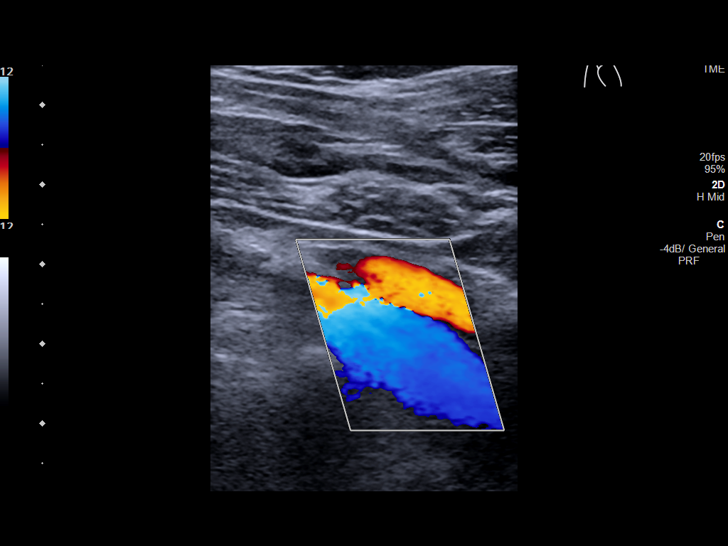
[im 3/32]
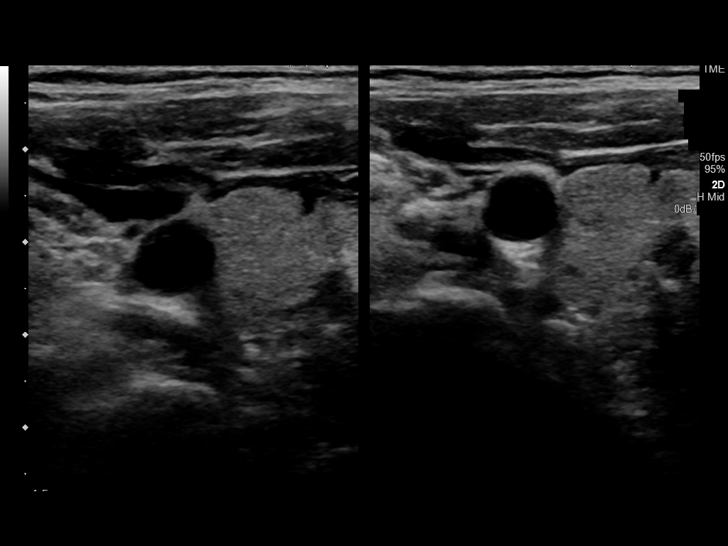
[im 6/32]
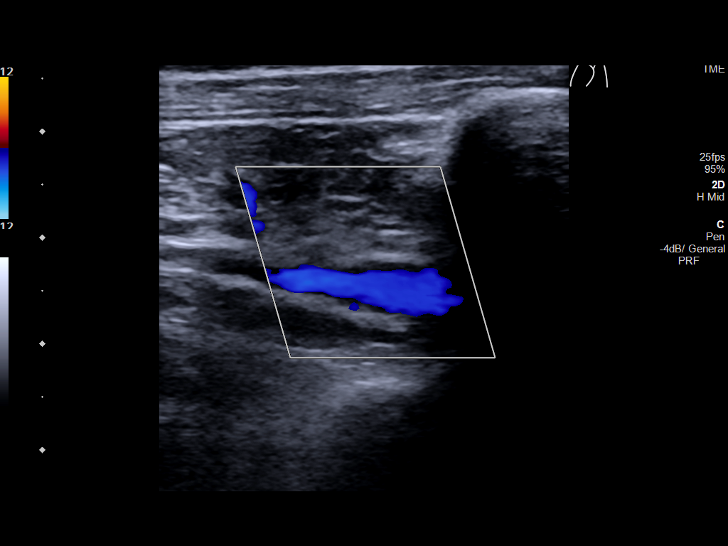
[im 9/32]
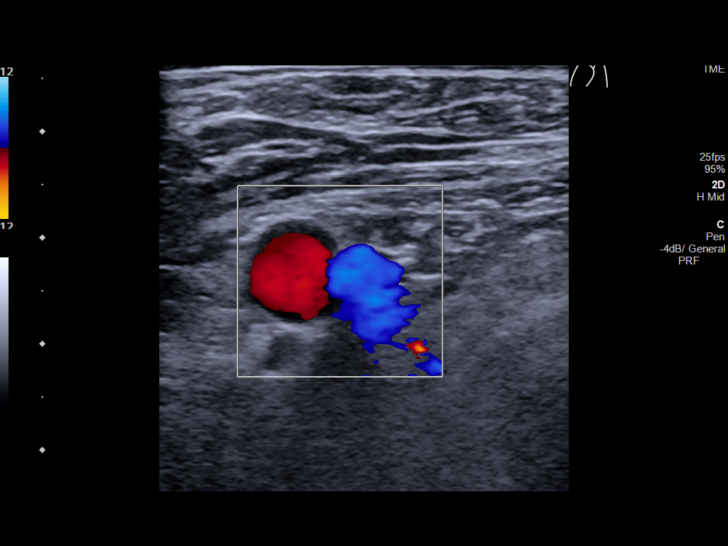
[im 12/32]
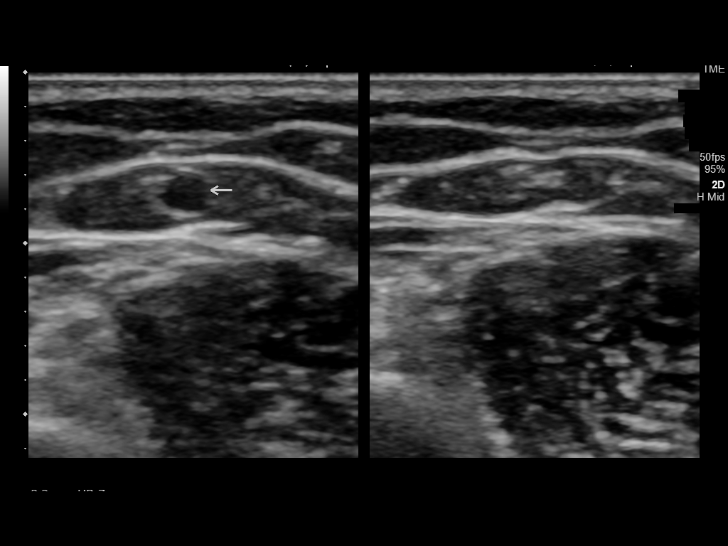
[im 15/32]
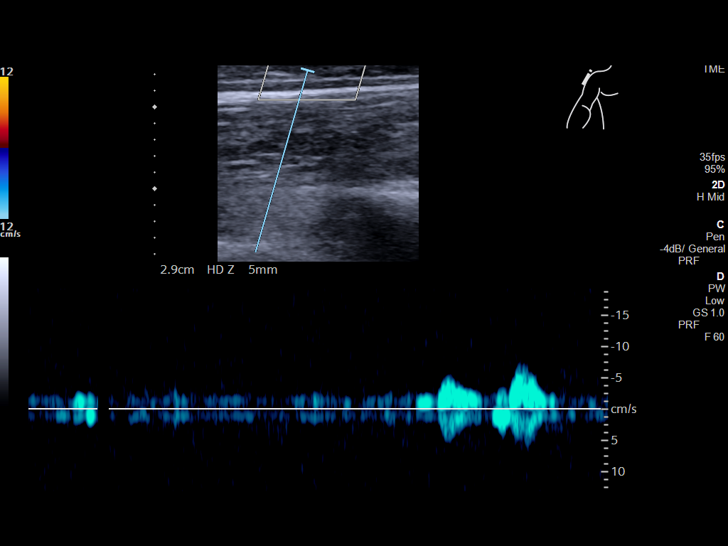
[im 17/32]
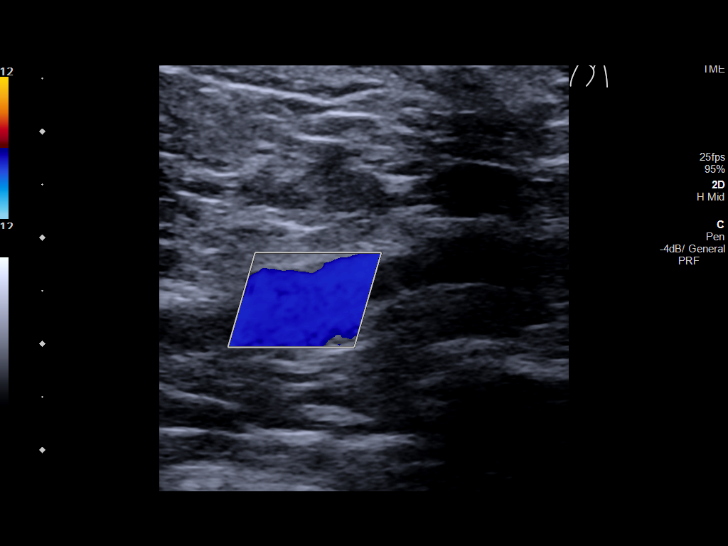
[im 19/32]
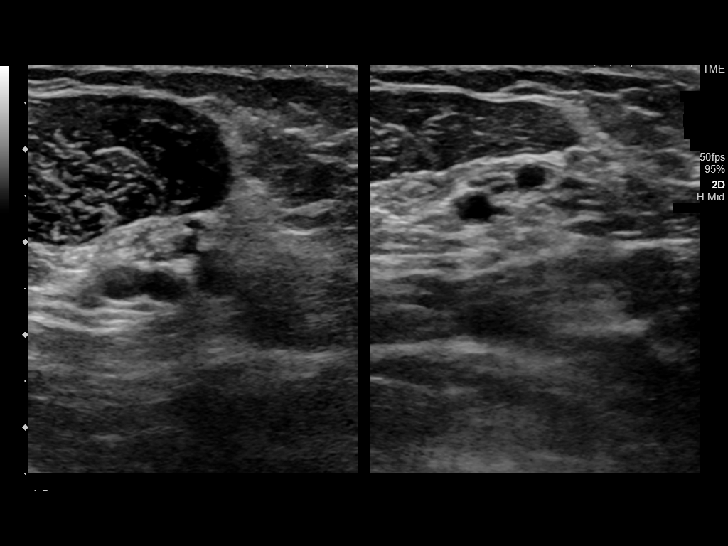
[im 22/32]
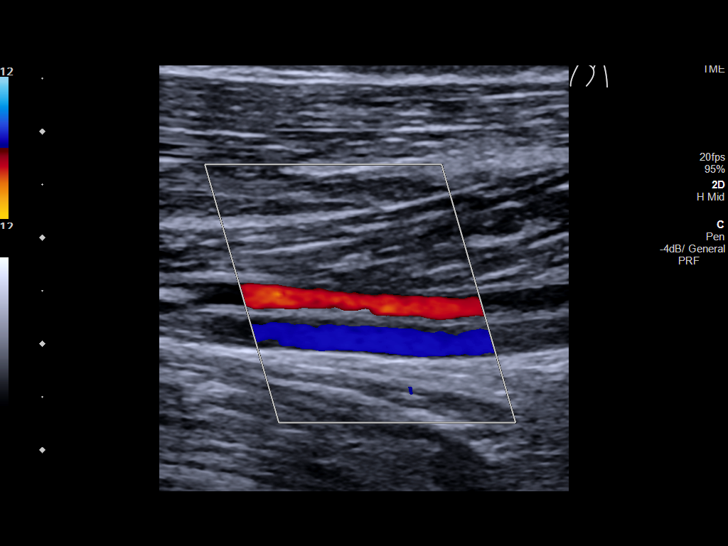
[im 24/32]
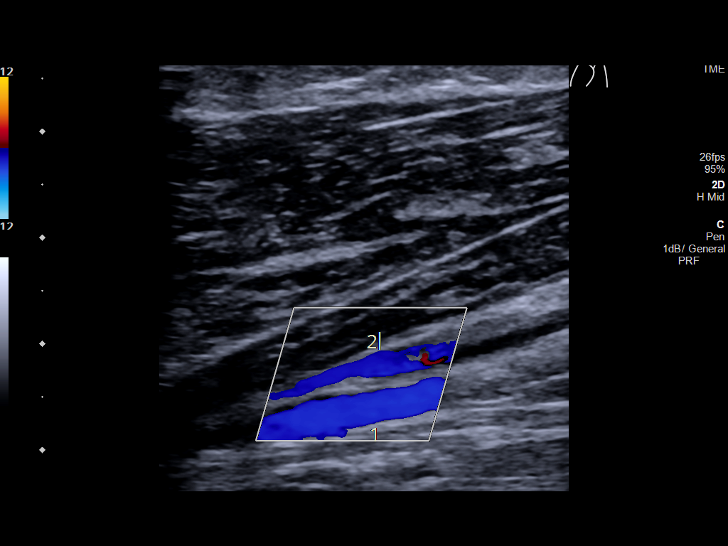
[im 27/32]
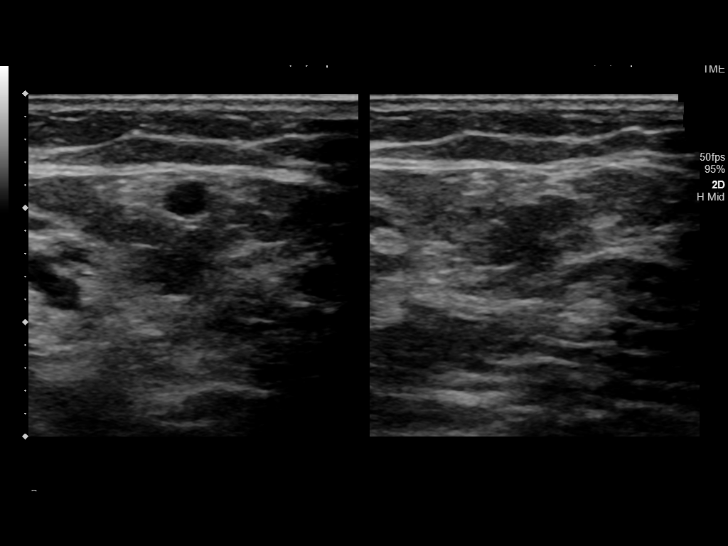
[im 30/32]
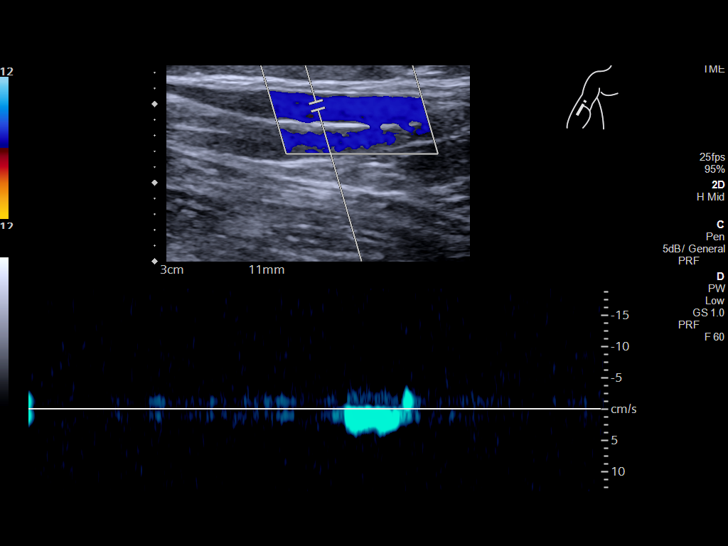

[Series 2: right upper extremity venous ultrasound · 1 of 2 slices shown (2 of 2)]
[im 1/2]
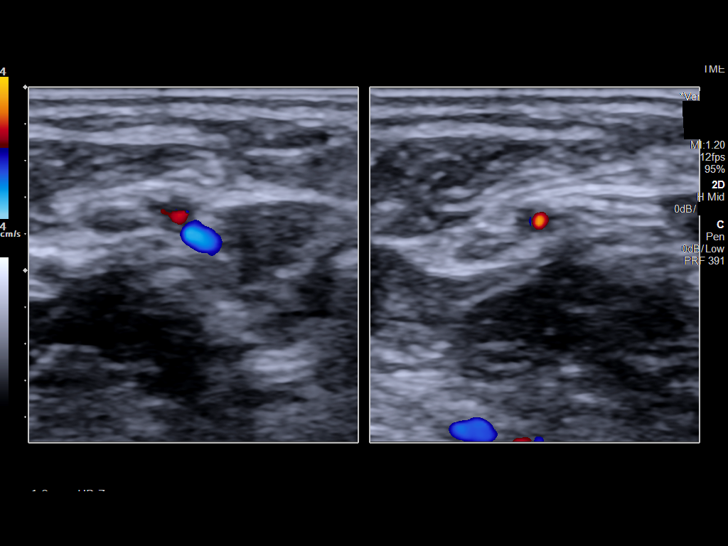

[13 of 24 positions shown; findings below may reference images not displayed]

FINDINGS: Contralateral Subclavian Vein: Respiratory phasicity is normal and
symmetric with the symptomatic side. No evidence of thrombus. Normal
compressibility.

Internal Jugular Vein: No evidence of thrombus. Normal
compressibility, respiratory phasicity and response to augmentation.

Subclavian Vein: No evidence of thrombus. Normal compressibility,
respiratory phasicity and response to augmentation.

Axillary Vein: No evidence of thrombus. Normal compressibility,
respiratory phasicity and response to augmentation.

Cephalic Vein: No evidence of thrombus. Normal compressibility,
respiratory phasicity and response to augmentation.

Basilic Vein: No evidence of thrombus. Normal compressibility,
respiratory phasicity and response to augmentation.

Brachial Veins: No evidence of thrombus. Normal compressibility,
respiratory phasicity and response to augmentation.

Radial Veins: No evidence of thrombus. Normal compressibility,
respiratory phasicity and response to augmentation.

Ulnar Veins: No evidence of thrombus. Normal compressibility,
respiratory phasicity and response to augmentation.

Venous Reflux:  None visualized.

Other Findings:  None visualized.
IMPRESSION: No evidence of DVT within the  upper extremity.

## 2018-07-06 MED ORDER — OXYCODONE-ACETAMINOPHEN 5-325 MG PO TABS: 1.0000 | ORAL_TABLET | ORAL | 0 refills | Status: AC | PRN

## 2018-07-06 MED ORDER — OXYCODONE-ACETAMINOPHEN 5-325 MG PO TABS
1.0000 | ORAL_TABLET | Freq: Once | ORAL | Status: AC
  Administered 2018-07-06: 1 via ORAL
  Filled 2018-07-06: qty 1

## 2018-07-06 NOTE — ED Provider Notes (Signed)
[facility] Emergency Department Provider Note  ____________________________________________  Time seen: Approximately 6:23 PM  I have reviewed the triage vital signs and the nursing notes.   HISTORY  Chief Complaint Arm Pain (right)    HPI Brandi Rojas is a 63 y.o. female presents emergency department for evaluation of right upper arm pain since this morning.  Pain does not change with range of motion.  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  No change in sensation to forearm or hand.  No falls.  Patient is taking Plavix.  No headache, shortness of breath, chest pain, nausea, vomiting, abdominal pain.   Past Medical History:  Diagnosis Date  . Diabetes mellitus without complication ([place])   . Hypertension   . Stroke [facility])     Patient Active Problem List   Diagnosis Date Noted  . TIA (transient ischemic attack) 04/18/2018  . CVA (cerebral vascular accident) ([place]) 04/18/2018    Past Surgical History:  Procedure Laterality Date  . none      Prior to Admission medications   Medication Sig Start Date End Date Taking? Authorizing Provider  amLODipine (NORVASC) 5 MG tablet Take 1 tablet (5 mg total) by mouth daily. 04/19/18 04/19/19  Bettey Costa, MD  aspirin EC 81 MG EC tablet Take 1 tablet (81 mg total) by mouth daily. 04/19/18   Bettey Costa, MD  blood glucose meter kit and supplies KIT Dispense based on patient and insurance preference. Use up to four times daily as directed. (FOR ICD-9 250.00, 250.01). 04/19/18   Bettey Costa, MD  clopidogrel (PLAVIX) 75 MG tablet Take 1 tablet (75 mg total) by mouth daily. 04/19/18   Bettey Costa, MD  insulin glargine (LANTUS) 100 UNIT/ML injection 10 units at night daily 04/19/18 04/19/19  Bettey Costa, MD  nicotine (NICODERM CQ - DOSED IN MG/24 HOURS) 21 mg/24hr patch Place 1 patch (21 mg total) onto the skin daily. 04/20/18   Bettey Costa, MD  oxyCODONE-acetaminophen (PERCOCET) 5-325 MG tablet Take  1 tablet by mouth every 4 (four) hours as needed. 07/06/18 07/06/19  Laban Emperor, PA-C  rosuvastatin (CRESTOR) 40 MG tablet Take 1 tablet (40 mg total) by mouth at bedtime. 04/19/18 04/19/19  Bettey Costa, MD    Allergies Patient has no known allergies.  Family History  Problem Relation Age of Onset  . CVA Neg Hx   . Hypertension Neg Hx     Social History Social History   Tobacco Use  . Smoking status: Former Smoker    Packs/day: 0.50    Types: Cigarettes    Quit date: 04/08/2018    Years since quitting: 0.2  . Smokeless tobacco: Never Used  Substance Use Topics  . Alcohol use: Not Currently    Alcohol/week: 7.0 standard drinks    Types: 7 Cans of beer per week    Comment: 2 40oz a day  . Drug use: No     Review of Systems  Constitutional: No fever/chills Cardiovascular: No chest pain. Respiratory: No SOB. Gastrointestinal: No abdominal pain.  No nausea, no vomiting.  Musculoskeletal: Positive for right upper arm pain. Skin: Negative for rash, abrasions, lacerations, ecchymosis. Neurological: Negative for headaches, numbness or tingling   ____________________________________________   PHYSICAL EXAM:  VITAL SIGNS: ED Triage Vitals  Enc Vitals Group     BP 07/06/18 1647 132/71     Pulse Rate 07/06/18 1647 79     Resp 07/06/18 1647 16     Temp 07/06/18 1647  98 F (36.7 C)     Temp Source 07/06/18 1647 Oral     SpO2 07/06/18 1647 96 %     Weight 07/06/18 1648 125 lb (56.7 kg)     Height 07/06/18 1648 5' 7" (1.702 m)     Head Circumference --      Peak Flow --      Pain Score 07/06/18 1648 5     Pain Loc --      Pain Edu? --      Excl. in [place]? --      Constitutional: Alert and oriented. Well appearing and in no acute distress. Eyes: Conjunctivae are normal. PERRL. EOMI. Head: Atraumatic. ENT:      Ears:      Nose: No congestion/rhinnorhea.      Mouth/Throat: Mucous membranes are moist.  Neck: No stridor.  Cardiovascular: Normal rate, regular rhythm.   Good peripheral circulation.  Symmetric radial pulses bilaterally. Respiratory: Normal respiratory effort without tachypnea or retractions. Lungs CTAB. Good air entry to the bases with no decreased or absent breath sounds. Musculoskeletal: Full range of motion to all extremities. No gross deformities appreciated.  Tenderness to palpation to anterior right shoulder and upper biceps. Neurologic:  Normal speech and language. No gross focal neurologic deficits are appreciated.  Skin:  Skin is warm, dry and intact. No rash noted. Psychiatric: Mood and affect are normal. Speech and behavior are normal. Patient exhibits appropriate insight and judgement.   ____________________________________________   LABS (all labs ordered are listed, but only abnormal results are displayed)  Labs Reviewed  BASIC METABOLIC PANEL - Abnormal; Notable for the following components:      Result Value   Glucose, Bld 228 (*)    All other components within normal limits  GLUCOSE, CAPILLARY - Abnormal; Notable for the following components:   Glucose-Capillary 247 (*)    All other components within normal limits  CBC WITH DIFFERENTIAL/PLATELET  PROTIME-INR  APTT  TROPONIN I  CBG MONITORING, ED   ____________________________________________  EKG   ____________________________________________  RADIOLOGY Robinette Haines, personally viewed and evaluated these images (plain radiographs) as part of my medical decision making, as well as reviewing the written report by the radiologist.  Dg Cervical Spine 2-3 Views  Result Date: 07/06/2018 CLINICAL DATA:  Right arm pain, right arm paralysis. EXAM: CERVICAL SPINE - 2-3 VIEW COMPARISON:  None. FINDINGS: Disc space narrowing and spurring throughout the cervical spine, most pronounced at C5-6. Diffuse bilateral degenerative facet disease. No fracture or subluxation. Prevertebral soft tissues are normal. IMPRESSION: Degenerative disc and facet disease.  No acute bony  abnormality. Electronically Signed   By: Rolm Baptise M.D.   On: 07/06/2018 19:15   Dg Shoulder Right  Result Date: 07/06/2018 CLINICAL DATA:  Right arm pain EXAM: RIGHT SHOULDER - 2+ VIEW COMPARISON:  None. FINDINGS: Degenerative changes in the [facility] joint with joint space narrowing and spurring. Glenohumeral joint is maintained. No acute bony abnormality. Specifically, no fracture, subluxation, or dislocation. Soft tissues are intact. IMPRESSION: Degenerative changes in the right AC joint. No acute bony abnormality. Electronically Signed   By: Rolm Baptise M.D.   On: 07/06/2018 19:16   Ct Head Wo Contrast  Result Date: 07/06/2018 CLINICAL DATA:  RIGHT arm pain aching since this morning, recent stroke in March 2020 with residual RIGHT arm paralysis, deficits to RIGHT leg, and aphasia; past history hypertension, diabetes mellitus, former smoker EXAM: CT HEAD WITHOUT CONTRAST TECHNIQUE: Contiguous axial images were obtained from the base  of the skull through the vertex without intravenous contrast. Sagittal and coronal MPR images reconstructed from axial data set. COMPARISON:  04/18/2018 CT head and MR brain exams FINDINGS: Brain: Mild atrophy. Normal ventricular morphology. No midline shift or mass effect. Scattered mild small vessel chronic ischemic changes of deep cerebral white matter. More focal hypodensities identified within deep LEFT frontal white matter consistent with infarct seen in March. Probable periventricular white matter infarct RIGHT frontal region. No intracranial hemorrhage, mass lesion or evidence of acute infarction. No extra-axial fluid collections. Vascular: Unremarkable Skull: Normal appearance Sinuses/Orbits: Clear Other: N/A IMPRESSION: Atrophy with small vessel chronic ischemic changes of deep cerebral white matter. 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 No acute intracranial abnormalities. Electronically Signed   By: Lavonia Dana M.D.   On: 07/06/2018 19:13    US Venous Img Upper Uni Right  Result Date: 07/06/2018 CLINICAL DATA:  Right arm pain for 8 hours EXAM: RIGHT UPPER EXTREMITY VENOUS DOPPLER ULTRASOUND TECHNIQUE: Gray-scale sonography with graded compression, as well as color Doppler and duplex ultrasound were performed to evaluate the upper extremity deep venous system from the level of the subclavian vein and including the jugular, axillary, basilic, radial, ulnar and upper cephalic vein. Spectral Doppler was utilized to evaluate flow at rest and with distal augmentation maneuvers. COMPARISON:  None. FINDINGS: Contralateral Subclavian Vein: Respiratory phasicity is normal and symmetric with the symptomatic side. No evidence of thrombus. Normal compressibility. Internal Jugular Vein: No evidence of thrombus. Normal compressibility, respiratory phasicity and response to augmentation. Subclavian Vein: No evidence of thrombus. Normal compressibility, respiratory phasicity and response to augmentation. Axillary Vein: No evidence of thrombus. Normal compressibility, respiratory phasicity and response to augmentation. Cephalic Vein: No evidence of thrombus. Normal compressibility, respiratory phasicity and response to augmentation. Basilic Vein: No evidence of thrombus. Normal compressibility, respiratory phasicity and response to augmentation. Brachial Veins: No evidence of thrombus. Normal compressibility, respiratory phasicity and response to augmentation. Radial Veins: No evidence of thrombus. Normal compressibility, respiratory phasicity and response to augmentation. Ulnar Veins: No evidence of thrombus. Normal compressibility, respiratory phasicity and response to augmentation. Venous Reflux:  None visualized. Other Findings:  None visualized. IMPRESSION: No evidence of DVT within the  upper extremity. Electronically Signed   By: Rolm Baptise M.D.   On: 07/06/2018 20:17    ____________________________________________    PROCEDURES  Procedure(s)  performed:    Procedures    Medications  oxyCODONE-acetaminophen (PERCOCET/ROXICET) 5-325 MG per tablet 1 tablet (1 tablet Oral Given 07/06/18 2043)     ____________________________________________   INITIAL IMPRESSION / ASSESSMENT AND PLAN / ED COURSE  Pertinent labs & imaging results that were available during my care of the patient were reviewed by me and considered in my medical decision making (see chart for details).  Review of the [place] CSRS was performed in accordance of the [place] prior to dispensing any controlled drugs.     Patient presenting the emergency department for evaluation of right upper upper arm pain this morning.  Vital signs and exam are reassuring.  CT head consistent with chronic changes.  Neck and shoulder x-ray consistent with degenerative changes.  Ultrasound negative for DVT.  Dr. Anell Barr has evaluated patient and agrees with plan of care.  She will be given a short course of pain medication.  Patient will be discharged home with prescriptions for a short course of Percocet. Patient is to follow up with primary care as directed. Patient is given ED precautions to return  to the ED for any worsening or new symptoms.     ____________________________________________  FINAL CLINICAL IMPRESSION(S) / ED DIAGNOSES  Final diagnoses:  Arm pain  Osteoarthritis of right shoulder, unspecified osteoarthritis type      NEW MEDICATIONS STARTED DURING THIS VISIT:  ED Discharge Orders         Ordered    oxyCODONE-acetaminophen (PERCOCET) 5-325 MG tablet  Every 4 hours PRN     07/06/18 2036              This chart was dictated using voice recognition software/Dragon. Despite best efforts to proofread, errors can occur which can change the meaning. Any change was purely unintentional.    Laban Emperor, PA-C 07/06/18 2053    Harvest Dark, MD 07/06/18 [phone]

## 2018-07-06 NOTE — ED Triage Notes (Signed)
Patient presents with right arm pain/aching that started this AM. Reports Recent stroke in March 2020 that left her with right arm paralysis, deficits to right leg, and aphasia. HX diabetes

## 2018-07-06 NOTE — ED Provider Notes (Signed)
-----------------------------------------   8:34 PM on 07/06/2018 -----------------------------------------  Patient seen and evaluated by myself in conjunction with Jen Mow.  Patient appears well, states the right arm pain has diminished substantially but continues to have some discomfort.  Patient's work-up is essentially been negative.  Negative imaging and negative ultrasound.  We will discharge with a short course of pain medication have the patient follow-up with her doctor.  Patient agreeable to plan of care.  No concerning findings on my evaluation.   Harvest Dark, MD 07/06/18 2034

## 2018-07-06 NOTE — ED Notes (Signed)
First Nurse Note: Pt to ED via POV c/o right arm pain.  Pt has CVA in March with right sided deficits. Pt has not been able to use or feel the right arm since March. Pt woke up this morning with her right arm aching. Home health nurse wanted pt to come have it checked out.

## 2018-07-06 NOTE — ED Notes (Signed)
Daughter called and left phone number

## 2018-07-06 NOTE — ED Notes (Signed)
Pt c/o right shoulder pain that started this am. Reports pain radiates down right arm. Denies injury. Hx CVA with residual right sided weakness per pt report.

## 2020-03-27 ENCOUNTER — Emergency Department: Payer: BLUE CROSS/BLUE SHIELD

## 2020-03-27 ENCOUNTER — Observation Stay
Admission: EM | Admit: 2020-03-27 | Discharge: 2020-03-28 | Disposition: A | Discharge status: Home / Self Care | Payer: BLUE CROSS/BLUE SHIELD | Attending: Hospitalist | Admitting: Hospitalist

## 2020-03-27 ENCOUNTER — Encounter: Payer: Self-pay | Admitting: Radiology

## 2020-03-27 DIAGNOSIS — R29898 Other symptoms and signs involving the musculoskeletal system: Secondary | ICD-10-CM | POA: Diagnosis not present

## 2020-03-27 DIAGNOSIS — Z87891 Personal history of nicotine dependence: Secondary | ICD-10-CM | POA: Insufficient documentation

## 2020-03-27 DIAGNOSIS — Z7902 Long term (current) use of antithrombotics/antiplatelets: Secondary | ICD-10-CM | POA: Insufficient documentation

## 2020-03-27 DIAGNOSIS — R531 Weakness: Secondary | ICD-10-CM | POA: Diagnosis not present

## 2020-03-27 DIAGNOSIS — I10 Essential (primary) hypertension: Secondary | ICD-10-CM | POA: Diagnosis not present

## 2020-03-27 DIAGNOSIS — E119 Type 2 diabetes mellitus without complications: Secondary | ICD-10-CM | POA: Diagnosis not present

## 2020-03-27 DIAGNOSIS — Z20822 Contact with and (suspected) exposure to covid-19: Secondary | ICD-10-CM | POA: Diagnosis not present

## 2020-03-27 DIAGNOSIS — Z79899 Other long term (current) drug therapy: Secondary | ICD-10-CM | POA: Insufficient documentation

## 2020-03-27 DIAGNOSIS — E1149 Type 2 diabetes mellitus with other diabetic neurological complication: Secondary | ICD-10-CM

## 2020-03-27 DIAGNOSIS — A419 Sepsis, unspecified organism: Secondary | ICD-10-CM | POA: Diagnosis present

## 2020-03-27 DIAGNOSIS — J101 Influenza due to other identified influenza virus with other respiratory manifestations: Secondary | ICD-10-CM

## 2020-03-27 DIAGNOSIS — Z794 Long term (current) use of insulin: Secondary | ICD-10-CM | POA: Diagnosis not present

## 2020-03-27 DIAGNOSIS — Z7982 Long term (current) use of aspirin: Secondary | ICD-10-CM | POA: Insufficient documentation

## 2020-03-27 DIAGNOSIS — Z8673 Personal history of transient ischemic attack (TIA), and cerebral infarction without residual deficits: Secondary | ICD-10-CM

## 2020-03-27 DIAGNOSIS — E785 Hyperlipidemia, unspecified: Secondary | ICD-10-CM | POA: Diagnosis present

## 2020-03-27 HISTORY — DX: Type 2 diabetes mellitus without complications: E11.9

## 2020-03-27 LAB — CBC
HCT: 44.7 % (ref 36.0–46.0)
Hemoglobin: 14.9 g/dL (ref 12.0–15.0)
MCH: 27.2 pg (ref 26.0–34.0)
MCHC: 33.3 g/dL (ref 30.0–36.0)
MCV: 81.7 fL (ref 80.0–100.0)
Platelets: 204 10*3/uL (ref 150–400)
RBC: 5.47 MIL/uL — ABNORMAL HIGH (ref 3.87–5.11)
RDW: 13.6 % (ref 11.5–15.5)
WBC: 8 10*3/uL (ref 4.0–10.5)
nRBC: 0 % (ref 0.0–0.2)

## 2020-03-27 LAB — COMPREHENSIVE METABOLIC PANEL
ALT: 20 U/L (ref 0–44)
AST: 31 U/L (ref 15–41)
Albumin: 4.3 g/dL (ref 3.5–5.0)
Alkaline Phosphatase: 65 U/L (ref 38–126)
Anion gap: 10 (ref 5–15)
BUN: 11 mg/dL (ref 8–23)
CO2: 23 mmol/L (ref 22–32)
Calcium: 9 mg/dL (ref 8.9–10.3)
Chloride: 101 mmol/L (ref 98–111)
Creatinine, Ser: 1.07 mg/dL — ABNORMAL HIGH (ref 0.44–1.00)
GFR, Estimated: 58 mL/min — ABNORMAL LOW (ref >60)
Glucose, Bld: 131 mg/dL — ABNORMAL HIGH (ref 70–99)
Potassium: 3.5 mmol/L (ref 3.5–5.1)
Sodium: 134 mmol/L — ABNORMAL LOW (ref 135–145)
Total Bilirubin: 0.9 mg/dL (ref 0.3–1.2)
Total Protein: 8.3 g/dL — ABNORMAL HIGH (ref 6.5–8.1)

## 2020-03-27 LAB — APTT: aPTT: 30 seconds (ref 24–36)

## 2020-03-27 LAB — DIFFERENTIAL
Abs Immature Granulocytes: 0.03 10*3/uL (ref 0.00–0.07)
Basophils Absolute: 0.1 10*3/uL (ref 0.0–0.1)
Basophils Relative: 1 %
Eosinophils Absolute: 0 10*3/uL (ref 0.0–0.5)
Eosinophils Relative: 0 %
Immature Granulocytes: 0 %
Lymphocytes Relative: 16 %
Lymphs Abs: 1.3 10*3/uL (ref 0.7–4.0)
Monocytes Absolute: 0.7 10*3/uL (ref 0.1–1.0)
Monocytes Relative: 9 %
Neutro Abs: 6 10*3/uL (ref 1.7–7.7)
Neutrophils Relative %: 74 %

## 2020-03-27 LAB — RESP PANEL BY RT-PCR (FLU A&B, COVID) ARPGX2
Influenza A by PCR: POSITIVE — AB
Influenza B by PCR: NEGATIVE
SARS Coronavirus 2 by RT PCR: NEGATIVE

## 2020-03-27 LAB — TSH: TSH: 1.001 u[IU]/mL (ref 0.350–4.500)

## 2020-03-27 LAB — PROTIME-INR
INR: 1.1 (ref 0.8–1.2)
Prothrombin Time: 13.4 seconds (ref 11.4–15.2)

## 2020-03-27 LAB — T4, FREE: Free T4: 0.91 ng/dL (ref 0.61–1.12)

## 2020-03-27 LAB — LACTIC ACID, PLASMA: Lactic Acid, Venous: 1.1 mmol/L (ref 0.5–1.9)

## 2020-03-27 LAB — CBG MONITORING, ED: Glucose-Capillary: 131 mg/dL — ABNORMAL HIGH (ref 70–99)

## 2020-03-27 LAB — D-DIMER, QUANTITATIVE: D-Dimer, Quant: 0.66 ug/mL-FEU — ABNORMAL HIGH (ref 0.00–0.50)

## 2020-03-27 IMAGING — CT CT HEAD CODE STROKE
3 series · 15 of 47 positions shown, 18 images · non-contrast
Comparison: Prior MRI from [DATE]

CLINICAL DATA: Code stroke. Initial evaluation for increasing
right-sided weakness.

EXAM:
CT HEAD WITHOUT CONTRAST
TECHNIQUE: Contiguous axial images were obtained from the base of the skull
through the vertex without intravenous contrast.

[Series 3: head wo · axial · 0.41mm/px · z∈[-142,-17]mm · 9 of 31 slices shown, 12 images]
[im 3/31  brain]
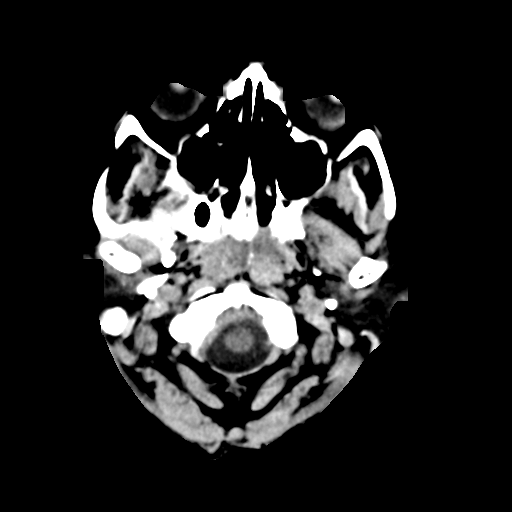
[im 3/31  bone]
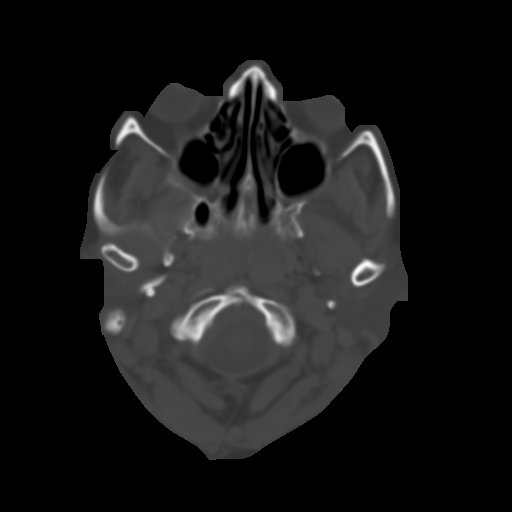
[im 6/31  brain]
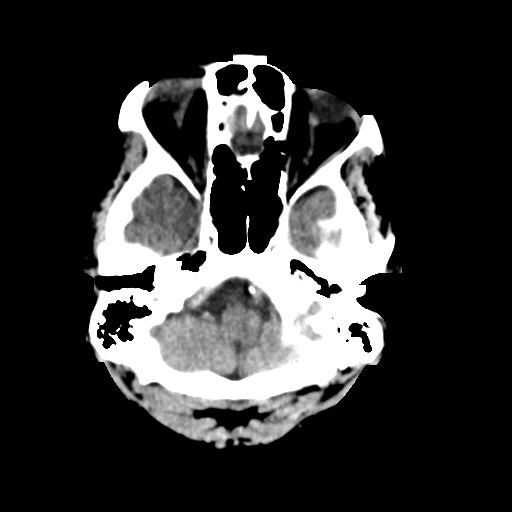
[im 9/31  brain]
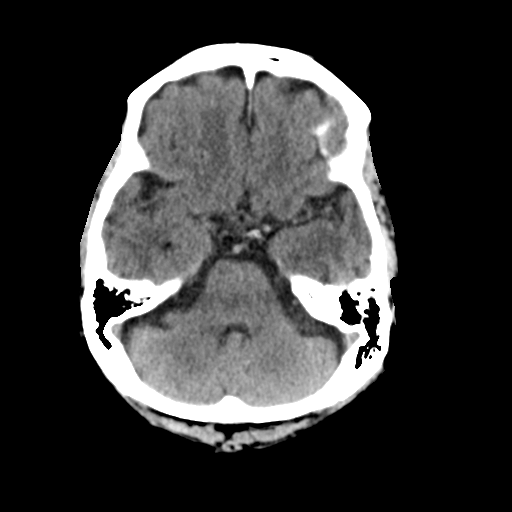
[im 12/31  brain]
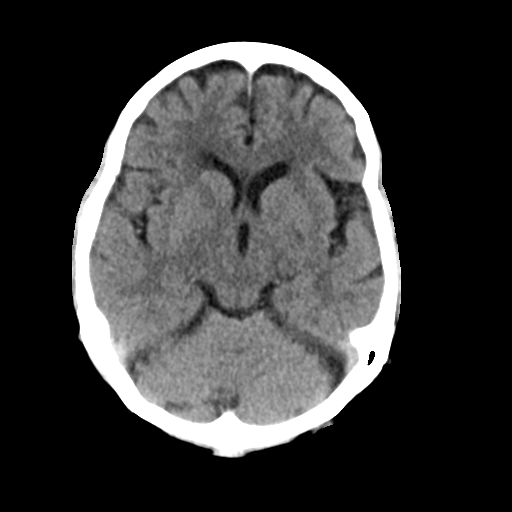
[im 16/31  brain]
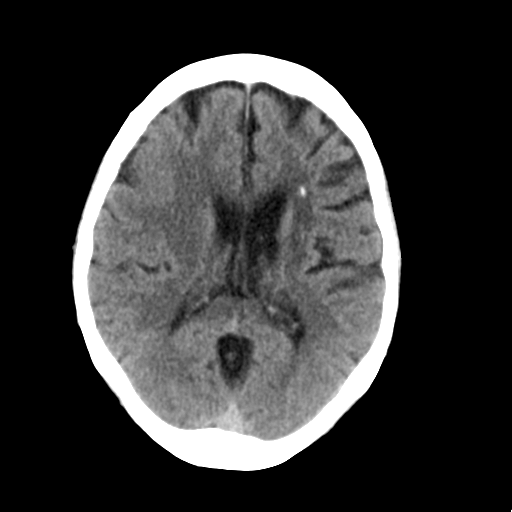
[im 16/31  bone]
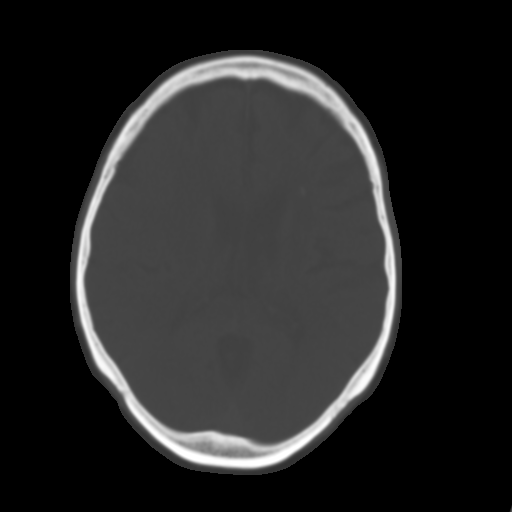
[im 19/31  brain]
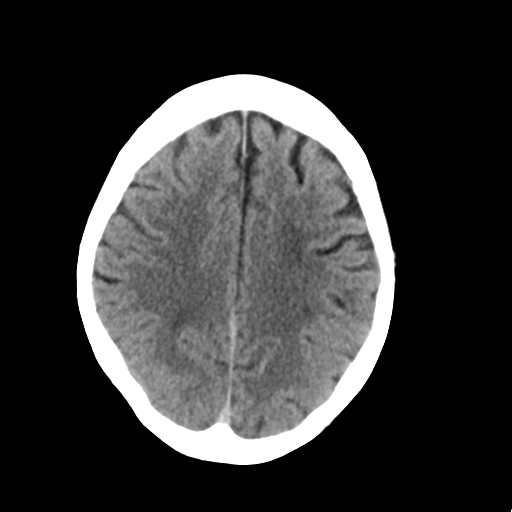
[im 22/31  brain]
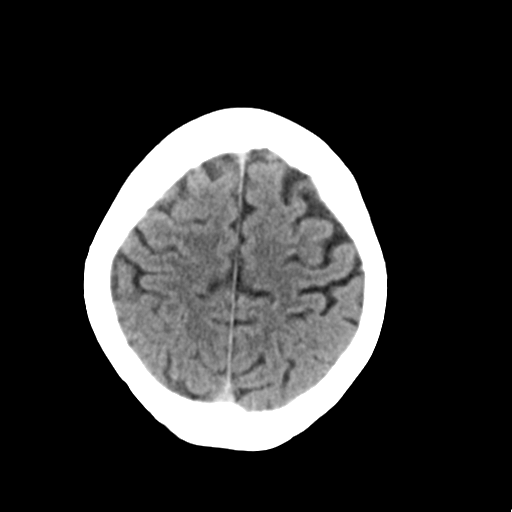
[im 25/31  brain]
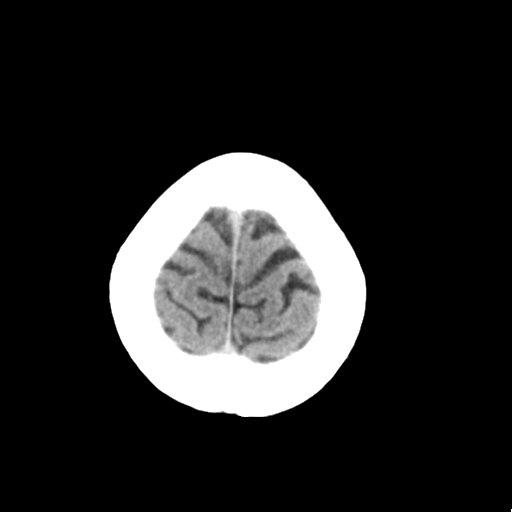
[im 28/31  brain]
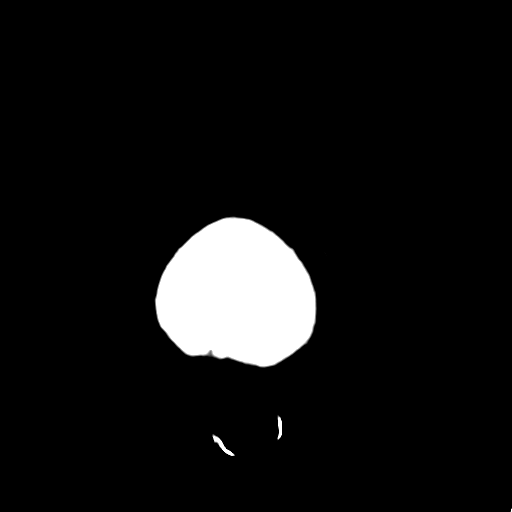
[im 28/31  bone]
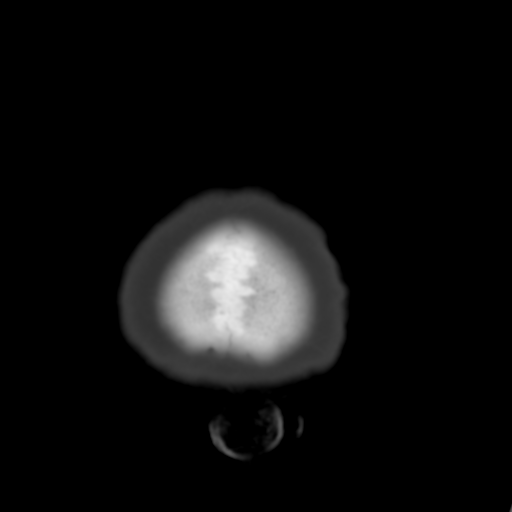

[Series 5: coronal soft tissue · coronal · 0.32mm/px · 3 of 67 slices shown]
[im 23/67  brain]
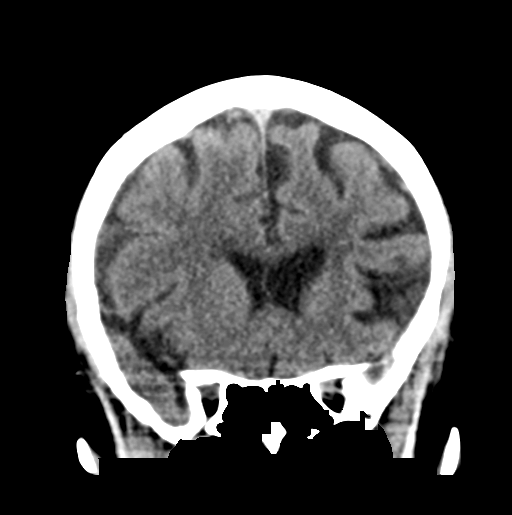
[im 30/67  brain]
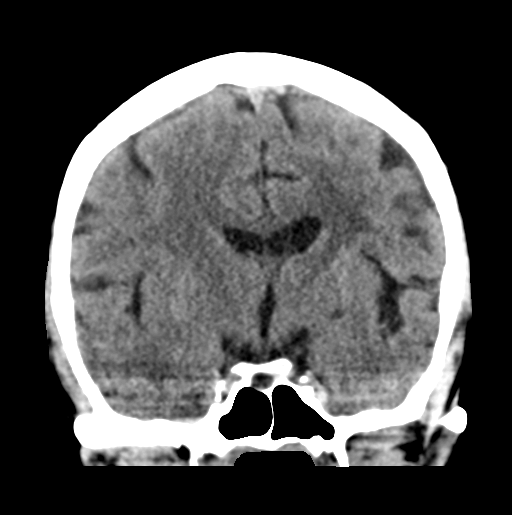
[im 37/67  brain]
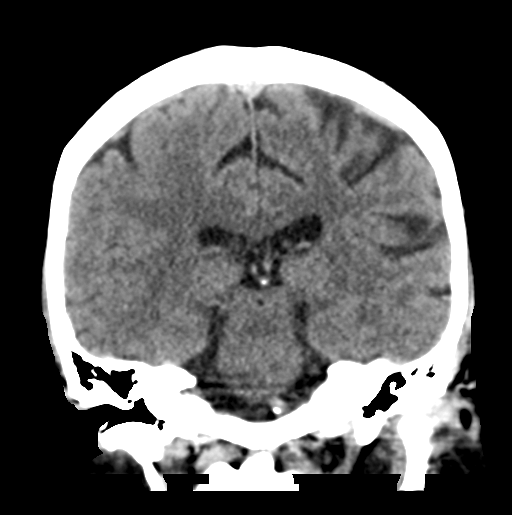

[Series 6: sagittal soft tissue · sagittal · 0.33mm/px · 3 of 56 slices shown]
[im 19/56  brain]
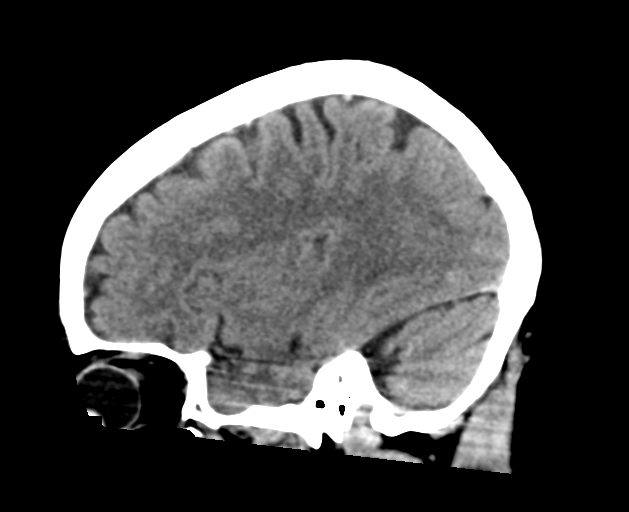
[im 28/56  brain]
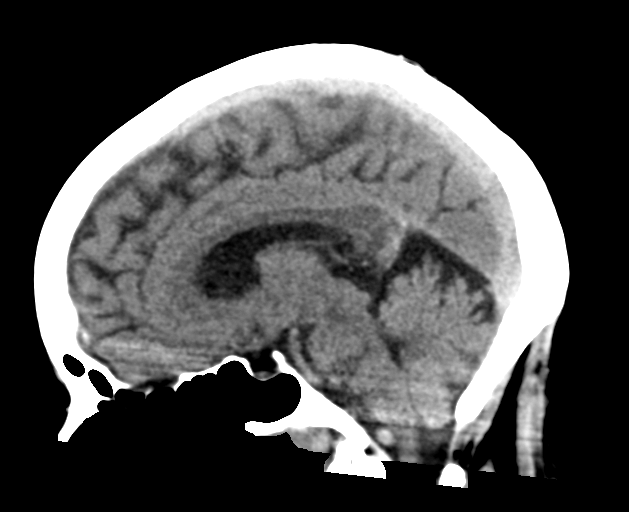
[im 37/56  brain]
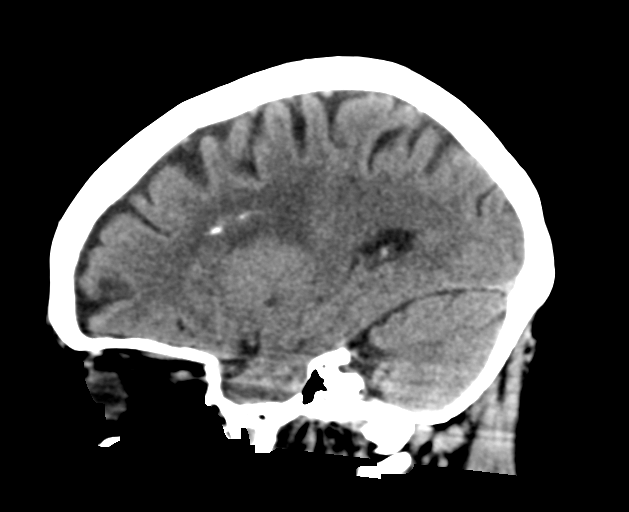

[15 of 47 positions shown; findings below may reference images not displayed]

FINDINGS: Brain: Age-related cerebral atrophy with chronic small vessel
ischemic disease. Chronic left cerebral infarcts involving the
periventricular left frontal region. Few scattered superimposed
parenchymal calcifications. No acute intracranial hemorrhage. No
acute large vessel territory infarct. No mass lesion, midline shift
or mass effect. No hydrocephalus or extra-axial fluid collection.

Vascular: No hyperdense vessel. Scattered vascular calcifications
noted within the carotid siphons.

Skull: Scalp soft tissues and calvarium within normal limits.

Sinuses/Orbits: Globes and orbital soft tissues demonstrate no acute
finding. Visualized paranasal sinuses are clear. No mastoid
effusion.

Other: None.

ASPECTS (Alberta Stroke Program Early CT Score)

- Ganglionic level infarction (caudate, lentiform nuclei, internal
capsule, insula, M1-M3 cortex): 7

- Supraganglionic infarction (M4-M6 cortex): 3

Total score (0-10 with 10 being normal): 10
IMPRESSION: 1. No acute intracranial infarct or other abnormality.
2. ASPECTS is 10.
3. Chronic watershed type infarcts involving the left cerebral
hemisphere.
4. Underlying atrophy with chronic small vessel ischemic disease.

Critical Value/emergent results were called by telephone at the time
of interpretation on [DATE] at [DATE] to provider JOSIELA
JOSIELA , who verbally acknowledged these results.

## 2020-03-27 IMAGING — DX DG CHEST 1V PORT
1 series · 1 of 1 positions shown · non-contrast
Comparison: [DATE]

CLINICAL DATA: Right arm and right leg weakness.

EXAM:
PORTABLE CHEST 1 VIEW

[chest ap]
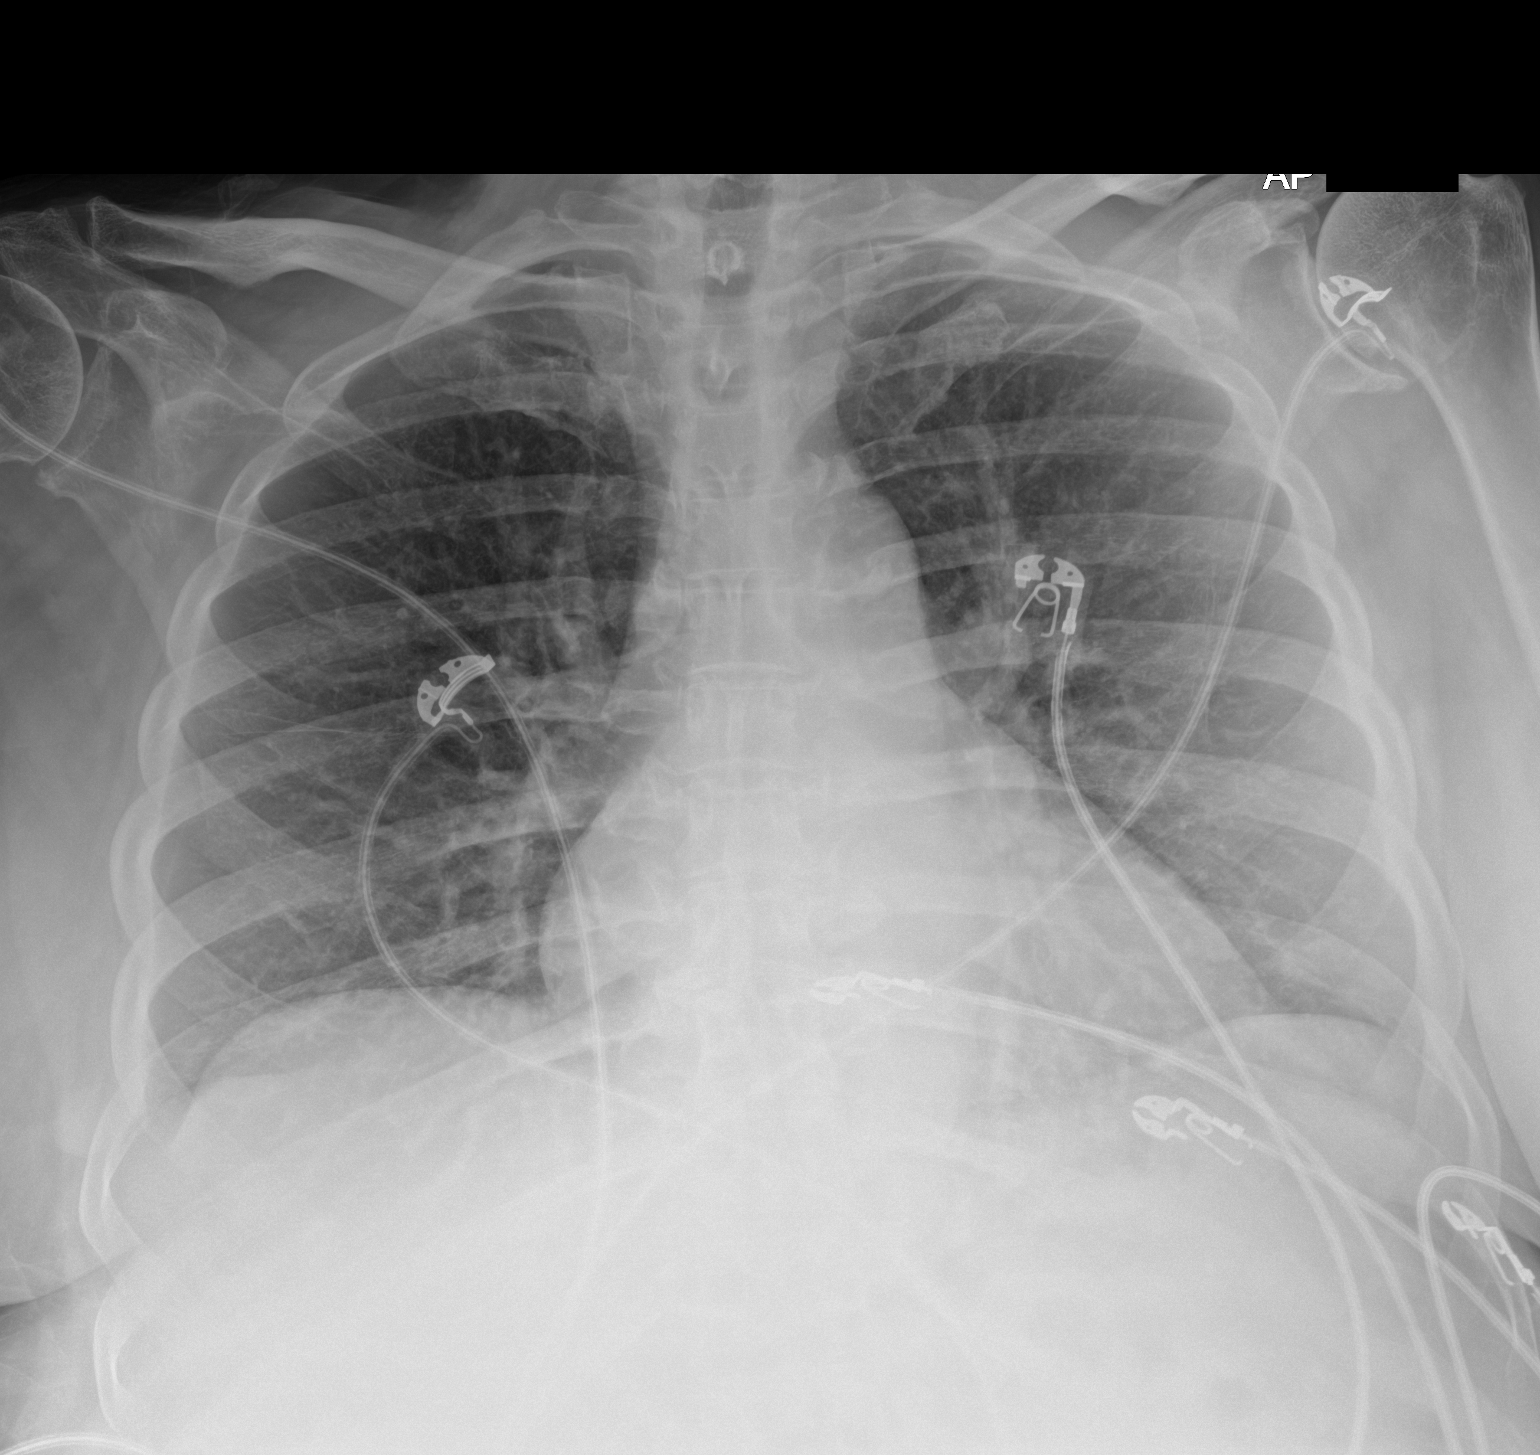

[1 of 1 positions shown; findings below may reference images not displayed]

FINDINGS: The heart size and mediastinal contours are within normal limits.
Both lungs are clear. The visualized skeletal structures are
unremarkable.
IMPRESSION: No active disease.

## 2020-03-27 MED ORDER — SODIUM CHLORIDE 0.9 % IV SOLN
2.0000 g | Freq: Once | INTRAVENOUS | Status: DC
  Administered 2020-03-27: 2 g via INTRAVENOUS
  Filled 2020-03-27: qty 2

## 2020-03-27 MED ORDER — STROKE: EARLY STAGES OF RECOVERY BOOK
Freq: Once | Status: DC
  Filled 2020-03-27: qty 1

## 2020-03-27 MED ORDER — CLOPIDOGREL BISULFATE 75 MG PO TABS
75.0000 mg | ORAL_TABLET | Freq: Every day | ORAL | Status: DC
Start: 2020-03-28 — End: 2020-03-28
  Administered 2020-03-28: 75 mg via ORAL
  Filled 2020-03-27: qty 1

## 2020-03-27 MED ORDER — LACTATED RINGERS IV SOLN: INTRAVENOUS | Status: DC

## 2020-03-27 MED ORDER — ACETAMINOPHEN 650 MG RE SUPP: 650.0000 mg | RECTAL | Status: DC | PRN

## 2020-03-27 MED ORDER — ACETAMINOPHEN 160 MG/5ML PO SOLN
650.0000 mg | ORAL | Status: DC | PRN
  Filled 2020-03-27: qty 20.3

## 2020-03-27 MED ORDER — IOHEXOL 350 MG/ML SOLN
100.0000 mL | Freq: Once | INTRAVENOUS | Status: AC | PRN
  Administered 2020-03-27: 100 mL via INTRAVENOUS

## 2020-03-27 MED ORDER — OSELTAMIVIR PHOSPHATE 75 MG PO CAPS
75.0000 mg | ORAL_CAPSULE | Freq: Two times a day (BID) | ORAL | Status: DC
  Administered 2020-03-28 (×2): 75 mg via ORAL
  Filled 2020-03-27 (×6): qty 1

## 2020-03-27 MED ORDER — ASPIRIN EC 81 MG PO TBEC
81.0000 mg | DELAYED_RELEASE_TABLET | Freq: Every day | ORAL | Status: DC
  Administered 2020-03-28: 11:00:00 81 mg via ORAL
  Filled 2020-03-27: qty 1

## 2020-03-27 MED ORDER — VANCOMYCIN HCL IN DEXTROSE 1-5 GM/200ML-% IV SOLN: 1000.0000 mg | Freq: Once | INTRAVENOUS | Status: DC

## 2020-03-27 MED ORDER — HEPARIN SODIUM (PORCINE) 5000 UNIT/ML IJ SOLN
5000.0000 [IU] | Freq: Three times a day (TID) | INTRAMUSCULAR | Status: DC
  Administered 2020-03-28 (×3): 5000 [IU] via SUBCUTANEOUS
  Filled 2020-03-27 (×3): qty 1

## 2020-03-27 MED ORDER — ROSUVASTATIN CALCIUM 20 MG PO TABS
40.0000 mg | ORAL_TABLET | Freq: Every day | ORAL | Status: DC
  Administered 2020-03-28: 40 mg via ORAL
  Filled 2020-03-27: qty 2

## 2020-03-27 MED ORDER — SODIUM CHLORIDE 0.9% FLUSH
3.0000 mL | Freq: Once | INTRAVENOUS | Status: AC
  Administered 2020-03-27: 3 mL via INTRAVENOUS

## 2020-03-27 MED ORDER — ACETAMINOPHEN 325 MG PO TABS
650.0000 mg | ORAL_TABLET | ORAL | Status: DC | PRN
Start: 2020-03-27 — End: 2020-03-28

## 2020-03-27 MED ORDER — ACETAMINOPHEN 500 MG PO TABS
ORAL_TABLET | ORAL | Status: AC
  Administered 2020-03-27: 1000 mg via ORAL
  Filled 2020-03-27: qty 2

## 2020-03-27 MED ORDER — INSULIN ASPART 100 UNIT/ML ~~LOC~~ SOLN: 0.0000 [IU] | Freq: Three times a day (TID) | SUBCUTANEOUS | Status: DC

## 2020-03-27 MED ORDER — METRONIDAZOLE IN NACL 5-0.79 MG/ML-% IV SOLN
500.0000 mg | Freq: Once | INTRAVENOUS | Status: DC
  Filled 2020-03-27: qty 100

## 2020-03-27 MED ORDER — ACETAMINOPHEN 500 MG PO TABS: 1000.0000 mg | ORAL_TABLET | Freq: Once | ORAL | Status: AC

## 2020-03-27 MED ORDER — AMLODIPINE BESYLATE 5 MG PO TABS
5.0000 mg | ORAL_TABLET | Freq: Every day | ORAL | Status: DC
  Administered 2020-03-28: 11:00:00 5 mg via ORAL
  Filled 2020-03-27: qty 1

## 2020-03-27 MED ORDER — VANCOMYCIN HCL 1750 MG/350ML IV SOLN
1750.0000 mg | Freq: Once | INTRAVENOUS | Status: DC
  Filled 2020-03-27: qty 350

## 2020-03-27 NOTE — ED Notes (Signed)
Patient transported to CT 

## 2020-03-27 NOTE — ED Triage Notes (Addendum)
FIRST NUSRE NOTE:   BIB POV via family member who reports that at 1630 today, pt developed right arm weakness, right leg weakness, inability to stand and confusion. Pt with hx of stroke approx 2-3 years ago with right sided deficits.   When asked what normal baseline is for right side, pt states she is usually able to use the right arm without difficulties (up until today at 1630), noted brace to right lower leg. Pt right arm noticeably weak on assessment, decreased verbal response times to basic questions, and slurred speech at time of triage. Pt also has temp of 101.3 and cough.   Unable to obtain CBG prior to going to CT scan due to non functioning glucose meter X 2 in triage. Taken to CT scan room 1 by April, RN. Code stroke initiated by this RN, Secretary Melody informed face to face. Charge nurse Erie Noe informed and pt to go to ED Room 2 after CT. Charge made aware that no CBG was taken prior to CT. VSS obtain by this RN prior to CT.

## 2020-03-27 NOTE — Consult Note (Signed)
CODE SEPSIS - PHARMACY COMMUNICATION  **Broad Spectrum Antibiotics should be administered within 1 hour of Sepsis diagnosis**  Time Code Sepsis Called/Page Received: 2004  Antibiotics Ordered: 2004  Time of 1st antibiotic administration: 2122  Additional action taken by pharmacy: N/A  If necessary, Name of Provider/Nurse Contacted: N/A    Derrek Gu ,PharmD Clinical Pharmacist  03/27/2020  8:13 PM

## 2020-03-27 NOTE — ED Notes (Signed)
Taken to CT by April, RN

## 2020-03-27 NOTE — H&P (Signed)
History and Physical    Brandi Rojas JME:[id number] DOB: [birth year] DOA: 03/27/2020  PCP: Danae Orleans, MD    Patient coming from:  Home   Chief Complaint:  Right sided weakness and facial droop / cva.   HPI: Brandi Rojas is a 65 y.o. female brought from home by ems called by family member for weakness and altered mental status.Pt has h/o cva and therefore code stroke called in ed and per teleneurology pt to cont with mri and complete stroke workup. although he suspects right sided deficit due to infection and poor po intake.Per report pt was last known well at 8 am and is not tpa candidate. Per report pt is always able to move her rt arm at baseline but is noted to be weak, slurred speech noted at time of triage.pt has nkda and is currently on DAPT with asa and plavix.per daughter at bedside pt had slept in all day without eating or drinking.She did not know who she was and was slow to respond.Normally she can move her rt arm little bit nad on my exam she did move her arm.  Pt meets sepsis criteria with RH/RR and fever and source of Influenza A. Pt has past medical history significant of DM II, HTN, h./o CVA, hyperlipidemia, h/o tobacco abuse.   ED Course:  Vitals:   03/27/20 1946 03/27/20 1947 03/27/20 2030  BP: (!) 144/93  (!) 146/79  Pulse: (!) 116  98  Resp: 18  (!) 36  Temp: (!) 101.3 F (38.5 C)    TempSrc: Oral    SpO2: 94%  92%  Weight:  72.6 kg   Height:  '5\' 7"'  (1.702 m)   In ED pt also meets sepsis criteria and was given vancomycin, metronidazole and cefepime but serology returned later showing pt is Influenza A positive and will need tamiflu.Pt also started on LR at 150 ml/hour.Pt meets sepsis criteria with RH/RR and fever and source of Influenza A.CTA cerebral perfusion with contrast is negative for CTA for LVO and negative for acute infarct and moderate atherosclerosis of major arteries in head and neck including proximal left M2 and P2 stenosis.Chest xray shows no  active disease.  Review of Systems:  Review of Systems  Unable to perform ROS: Other (disoriented.)     Past Medical History:  Diagnosis Date  . Diabetes mellitus without complication ([place])   . DM II (diabetes mellitus, type II), controlled ([place]) 03/27/2020  . Hypertension   . Stroke [facility])     Past Surgical History:  Procedure Laterality Date  . none       reports that she quit smoking about 1 years ago. Her smoking use included cigarettes. She smoked 0.50 packs per day. She has never used smokeless tobacco. She reports previous alcohol use of about 7.0 standard drinks of alcohol per week. She reports that she does not use drugs.  No Known Allergies  Family History  Problem Relation Age of Onset  . Diabetes Mother   . CVA Neg Hx   . Hypertension Neg Hx     Prior to Admission medications   Medication Sig Start Date End Date Taking? Authorizing Provider  amLODipine (NORVASC) 5 MG tablet Take 1 tablet (5 mg total) by mouth daily. 04/19/18 04/19/19  Bettey Costa, MD  aspirin EC 81 MG EC tablet Take 1 tablet (81 mg total) by mouth daily. 04/19/18   Bettey Costa, MD  blood glucose meter kit and supplies KIT Dispense based on patient and insurance preference. Use  up to four times daily as directed. (FOR ICD-9 250.00, 250.01). 04/19/18   Bettey Costa, MD  clopidogrel (PLAVIX) 75 MG tablet Take 1 tablet (75 mg total) by mouth daily. 04/19/18   Bettey Costa, MD  insulin glargine (LANTUS) 100 UNIT/ML injection 10 units at night daily 04/19/18 04/19/19  Bettey Costa, MD  nicotine (NICODERM CQ - DOSED IN MG/24 HOURS) 21 mg/24hr patch Place 1 patch (21 mg total) onto the skin daily. 04/20/18   Bettey Costa, MD  rosuvastatin (CRESTOR) 40 MG tablet Take 1 tablet (40 mg total) by mouth at bedtime. 04/19/18 04/19/19  Bettey Costa, MD    Physical Exam: Vitals:   03/27/20 1946 03/27/20 1947 03/27/20 2030  BP: (!) 144/93  (!) 146/79  Pulse: (!) 116  98  Resp: 18  (!) 36  Temp: (!) 101.3 F (38.5 C)     TempSrc: Oral    SpO2: 94%  92%  Weight:  72.6 kg   Height:  '5\' 7"'  (1.702 m)   Pt is alert,awake and follows commands is oriented to self and location but is having trouble recalling year and president. She is spontaneously moving all three ext except for rue which she can move her shoulder  but  Not her elbow or hands.   Physical Exam Vitals and nursing note reviewed.  Constitutional:      General: She is not in acute distress.    Appearance: Normal appearance. She is not ill-appearing.  HENT:     Head: Normocephalic and atraumatic.     Right Ear: External ear normal.     Left Ear: External ear normal.     Nose: Nose normal.     Mouth/Throat:     Mouth: Mucous membranes are moist.  Eyes:     Extraocular Movements: Extraocular movements intact.     Pupils: Pupils are equal, round, and reactive to light.  Neck:     Vascular: No carotid bruit.  Cardiovascular:     Rate and Rhythm: Normal rate and regular rhythm.     Pulses: Normal pulses.     Heart sounds: Normal heart sounds.  Pulmonary:     Effort: Pulmonary effort is normal. No respiratory distress.     Breath sounds: Normal breath sounds. No wheezing.  Abdominal:     General: Bowel sounds are normal. There is no distension.     Palpations: Abdomen is soft. There is no mass.     Tenderness: There is no abdominal tenderness. There is no guarding.     Hernia: No hernia is present.  Musculoskeletal:     Right lower leg: No edema.     Left lower leg: No edema.  Neurological:     Mental Status: She is alert. She is disoriented.     Cranial Nerves: Cranial nerves are intact.     Motor: Weakness present.     Deep Tendon Reflexes:     Reflex Scores:      Bicep reflexes are 1+ on the right side and 1+ on the left side.      Patellar reflexes are 1+ on the right side and 1+ on the left side.    Comments: RUE paralysis from prior stroke.     Labs on Admission: I have personally reviewed following labs and imaging  studies  No results for input(s): CKTOTAL, CKMB, TROPONINI in the last 72 hours. Lab Results  Component Value Date   WBC 8.0 03/27/2020   HGB 14.9 03/27/2020   HCT 44.7  03/27/2020   MCV 81.7 03/27/2020   PLT 204 03/27/2020    Recent Labs  Lab 03/27/20 2000  NA 134*  K 3.5  CL 101  CO2 23  BUN 11  CREATININE 1.07*  CALCIUM 9.0  PROT 8.3*  BILITOT 0.9  ALKPHOS 65  ALT 20  AST 31  GLUCOSE 131*   Lab Results  Component Value Date   CHOL 259 (H) 04/19/2018   HDL 33 (L) 04/19/2018   LDLCALC UNABLE TO CALCULATE IF TRIGLYCERIDE OVER 400 mg/dL 04/19/2018   TRIG 547 (H) 04/19/2018   No results found for: DDIMER Invalid input(s): POCBNP  Urinalysis No results found for: COLORURINE, APPEARANCEUR, LABSPEC, PHURINE, GLUCOSEU, HGBUR, BILIRUBINUR, KETONESUR, PROTEINUR, UROBILINOGEN, NITRITE, LEUKOCYTESUR  COVID-19 Labs  No results for input(s): DDIMER, FERRITIN, LDH, CRP in the last 72 hours.  Lab Results  Component Value Date   [place] NEGATIVE 03/27/2020    Radiological Exams on Admission: CT CEREBRAL PERFUSION W CONTRAST  Result Date: 03/27/2020 CLINICAL DATA:  Initial evaluation for acute stroke, increased left-sided weakness. EXAM: CT ANGIOGRAPHY HEAD AND NECK CT PERFUSION BRAIN TECHNIQUE: Multidetector CT imaging of the head and neck was performed using the standard protocol during bolus administration of intravenous contrast. Multiplanar CT image reconstructions and MIPs were obtained to evaluate the vascular anatomy. Carotid stenosis measurements (when applicable) are obtained utilizing NASCET criteria, using the distal internal carotid diameter as the denominator. Multiphase CT imaging of the brain was performed following IV bolus contrast injection. Subsequent parametric perfusion maps were calculated using RAPID software. CONTRAST:  162m OMNIPAQUE IOHEXOL 350 MG/ML SOLN COMPARISON:  Prior head CT from earlier the same day. FINDINGS: CTA NECK FINDINGS Aortic arch:  Visualized aortic arch of normal caliber with normal 3 vessel morphology. Mild atheromatous change about the arch and origin of the great vessels without hemodynamically significant stenosis. Visualized subclavian arteries widely patent. Right carotid system: Left carotid system: Right common carotid artery patent from its origin to the bifurcation without stenosis. Mild atheromatous change about the right bifurcation/proximal right ICA without significant stenosis. Right ICA patent distally without stenosis, dissection or occlusion. Vertebral arteries: Left common carotid artery patent from its origin to the bifurcation. Eccentric calcified plaque at the left carotid bulb/proximal left ICA without significant stenosis. Left ICA patent distally without stenosis, dissection or occlusion. Skeleton: Both vertebral arteries arise from subclavian arteries. No proximal subclavian artery stenosis. Right vertebral artery dominant. Vertebral arteries patent within the neck without stenosis, dissection or occlusion. Other neck: No visible acute osseous finding. No discrete or worrisome osseous lesions. Patient is edentulous. Soft tissues: No other acute soft tissue abnormality within the neck. No mass or adenopathy. Upper chest: Visualized upper chest demonstrates no acute finding. 4 mm nodular density along the right major fissure favored to reflect a small intra pulmonic lymph node. Review of the MIP images confirms the above findings CTA HEAD FINDINGS Anterior circulation: Petrous segments patent bilaterally. Scattered atheromatous plaque within the carotid siphons with no more than mild multifocal stenosis. A1 segments patent bilaterally. Normal anterior communicating artery complex. Anterior cerebral arteries patent to their distal aspects without stenosis. Left M1 patent proximally. Focal moderate proximal M2 stenosis, superior division (series 9, image 54). Left MCA branches perfused distally. Right M1 segment patent  without significant stenosis. Normal right MCA bifurcation. Distal right MCA branches well perfused. Posterior circulation: Both V4 segments patent to the vertebrobasilar junction without stenosis. Neither PICA well visualized. Focal non stenotic plaque noted at the vertebrobasilar junction. Basilar widely patent  distally to its distal aspect. Superior cerebellar arteries patent bilaterally. Both PCAs primarily supplied via the basilar. Short-segment moderate proximal P2 stenosis on the left (series 9, image 54). Additional scattered atheromatous irregularity within the PCAs without high-grade stenosis. Venous sinuses: Grossly patent allowing for timing the contrast bolus. Anatomic variants: None significant.  No aneurysm. Review of the MIP images confirms the above findings CT Brain Perfusion Findings: ASPECTS: 10. CBF (<30%) Volume: 23m Perfusion (Tmax>6.0s) volume: 074mMismatch Volume: 47m81mnfarction Location:Negative CT perfusion with no evidence for acute core infarct or other perfusion deficit. IMPRESSION: 1. Negative CTA for emergent large vessel occlusion. 2. Negative CT perfusion with no evidence for acute core infarct or other perfusion deficit. 3. Moderate atherosclerotic disease about the major arterial vasculature of the head and neck including moderate proximal left M2 and P2 stenoses. Critical Value/emergent results were called by telephone at the time of interpretation on 03/27/2020 at 8:35 pm to provider [facility] Systemwho verbally acknowledged these results. Electronically Signed   By: BenJeannine BogaD.   On: 03/27/2020 21:08   DG Chest Port 1 View  Result Date: 03/27/2020 CLINICAL DATA:  Right arm and right leg weakness. EXAM: PORTABLE CHEST 1 VIEW COMPARISON:  April 25, 2009 FINDINGS: The heart size and mediastinal contours are within normal limits. Both lungs are clear. The visualized skeletal structures are unremarkable. IMPRESSION: No active disease. Electronically Signed   By:  ThaVirgina NorfolkD.   On: 03/27/2020 21:05   CT HEAD CODE STROKE WO CONTRAST  Result Date: 03/27/2020 CLINICAL DATA:  Code stroke. Initial evaluation for increasing right-sided weakness. EXAM: CT HEAD WITHOUT CONTRAST TECHNIQUE: Contiguous axial images were obtained from the base of the skull through the vertex without intravenous contrast. COMPARISON:  Prior MRI from 04/18/2018 FINDINGS: Brain: Age-related cerebral atrophy with chronic small vessel ischemic disease. Chronic left cerebral infarcts involving the periventricular left frontal region. Few scattered superimposed parenchymal calcifications. No acute intracranial hemorrhage. No acute large vessel territory infarct. No mass lesion, midline shift or mass effect. No hydrocephalus or extra-axial fluid collection. Vascular: No hyperdense vessel. Scattered vascular calcifications noted within the carotid siphons. Skull: Scalp soft tissues and calvarium within normal limits. Sinuses/Orbits: Globes and orbital soft tissues demonstrate no acute finding. Visualized paranasal sinuses are clear. No mastoid effusion. Other: None. ASPECTS ([facility] Early [facility] Score) - Ganglionic level infarction (caudate, lentiform nuclei, internal capsule, insula, M1-M3 cortex): 7 - Supraganglionic infarction (M4-M6 cortex): 3 Total score (0-10 with 10 being normal): 10 IMPRESSION: 1. No acute intracranial infarct or other abnormality. 2. ASPECTS is 10. 3. 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 4. Underlying atrophy with chronic small vessel ischemic disease. Critical Value/emergent results were called by telephone at the time of interpretation on 03/27/2020 at 8:04 pm to provider [facility]who verbally acknowledged these results. Electronically Signed   By: BenJeannine BogaD.   On: 03/27/2020 20:09   CT ANGIO HEAD CODE STROKE Result Date: 03/27/2020 CLINICAL DATA:  Initial evaluation for acute stroke, increased  left-sided weakness. EXAM: CT ANGIOGRAPHY HEAD AND NECK CT PERFUSION BRAIN TECHNIQUE: Multidetector CT imaging of the head and neck was performed using the standard protocol during bolus administration of intravenous contrast. Multiplanar CT image reconstructions and MIPs were obtained to evaluate the vascular anatomy. Carotid stenosis measurements (when applicable) are obtained utilizing NASCET criteria, using the distal internal carotid diameter as the denominator. Multiphase CT imaging of the brain was performed following IV bolus contrast  injection. Subsequent parametric perfusion maps were calculated using RAPID software. CONTRAST:  1261m OMNIPAQUE IOHEXOL 350 MG/ML SOLN COMPARISON:  Prior head CT from earlier the same day. FINDINGS: CTA NECK FINDINGS Aortic arch: Visualized aortic arch of normal caliber with normal 3 vessel morphology. Mild atheromatous change about the arch and origin of the great vessels without hemodynamically significant stenosis. Visualized subclavian arteries widely patent. Right carotid system: Left carotid system: Right common carotid artery patent from its origin to the bifurcation without stenosis. Mild atheromatous change about the right bifurcation/proximal right ICA without significant stenosis. Right ICA patent distally without stenosis, dissection or occlusion. Vertebral arteries: Left common carotid artery patent from its origin to the bifurcation. Eccentric calcified plaque at the left carotid bulb/proximal left ICA without significant stenosis. Left ICA patent distally without stenosis, dissection or occlusion. Skeleton: Both vertebral arteries arise from subclavian arteries. No proximal subclavian artery stenosis. Right vertebral artery dominant. Vertebral arteries patent within the neck without stenosis, dissection or occlusion. Other neck: No visible acute osseous finding. No discrete or worrisome osseous lesions. Patient is edentulous. Soft tissues: No other acute soft  tissue abnormality within the neck. No mass or adenopathy. Upper chest: Visualized upper chest demonstrates no acute finding. 4 mm nodular density along the right major fissure favored to reflect a small intra pulmonic lymph node. Review of the MIP images confirms the above findings CTA HEAD FINDINGS Anterior circulation: Petrous segments patent bilaterally. Scattered atheromatous plaque within the carotid siphons with no more than mild multifocal stenosis. A1 segments patent bilaterally. Normal anterior communicating artery complex. Anterior cerebral arteries patent to their distal aspects without stenosis. Left M1 patent proximally. Focal moderate proximal M2 stenosis, superior division (series 9, image 54). Left MCA branches perfused distally. Right M1 segment patent without significant stenosis. Normal right MCA bifurcation. Distal right MCA branches well perfused. Posterior circulation: Both V4 segments patent to the vertebrobasilar junction without stenosis. Neither PICA well visualized. Focal non stenotic plaque noted at the vertebrobasilar junction. Basilar widely patent distally to its distal aspect. Superior cerebellar arteries patent bilaterally. Both PCAs primarily supplied via the basilar. Short-segment moderate proximal P2 stenosis on the left (series 9, image 54). Additional scattered atheromatous irregularity within the PCAs without high-grade stenosis. Venous sinuses: Grossly patent allowing for timing the contrast bolus. Anatomic variants: None significant.  No aneurysm. Review of the MIP images confirms the above findings CT Brain Perfusion Findings: ASPECTS: 10. CBF (<30%) Volume: 053mPerfusion (Tmax>6.0s) volume: 61m41mismatch Volume: 61mL3mfarction Location:Negative CT perfusion with no evidence for acute core infarct or other perfusion deficit. IMPRESSION: 1. Negative CTA for emergent large vessel occlusion. 2. Negative CT perfusion with no evidence for acute core infarct or other perfusion  deficit. 3. Moderate atherosclerotic disease about the major arterial vasculature of the head and neck including moderate proximal left M2 and P2 stenoses. Critical Value/emergent results were called by telephone at the time of interpretation on 03/27/2020 at 8:35 pm to provider [facility]EV[facility]Ab[facility] LLCho verbally acknowledged these results. Electronically Signed   By: BenjJeannine Boga.   On: 03/27/2020 21:08   CT ANGIO NECK CODE STROKE  Result Date: 03/27/2020 CLINICAL DATA:  Initial evaluation for acute stroke, increased left-sided weakness. EXAM: CT ANGIOGRAPHY HEAD AND NECK CT PERFUSION BRAIN TECHNIQUE: Multidetector CT imaging of the head and neck was performed using the standard protocol during bolus administration of intravenous contrast. Multiplanar CT image reconstructions and MIPs were obtained to evaluate the vascular anatomy. Carotid stenosis measurements (when applicable) are  obtained utilizing NASCET criteria, using the distal internal carotid diameter as the denominator. Multiphase CT imaging of the brain was performed following IV bolus contrast injection. Subsequent parametric perfusion maps were calculated using RAPID software. CONTRAST:  164m OMNIPAQUE IOHEXOL 350 MG/ML SOLN COMPARISON:  Prior head CT from earlier the same day. FINDINGS: CTA NECK FINDINGS Aortic arch: Visualized aortic arch of normal caliber with normal 3 vessel morphology. Mild atheromatous change about the arch and origin of the great vessels without hemodynamically significant stenosis. Visualized subclavian arteries widely patent. Right carotid system: Left carotid system: Right common carotid artery patent from its origin to the bifurcation without stenosis. Mild atheromatous change about the right bifurcation/proximal right ICA without significant stenosis. Right ICA patent distally without stenosis, dissection or occlusion. Vertebral arteries: Left common carotid artery patent from its origin to the bifurcation.  Eccentric calcified plaque at the left carotid bulb/proximal left ICA without significant stenosis. Left ICA patent distally without stenosis, dissection or occlusion. Skeleton: Both vertebral arteries arise from subclavian arteries. No proximal subclavian artery stenosis. Right vertebral artery dominant. Vertebral arteries patent within the neck without stenosis, dissection or occlusion. Other neck: No visible acute osseous finding. No discrete or worrisome osseous lesions. Patient is edentulous. Soft tissues: No other acute soft tissue abnormality within the neck. No mass or adenopathy. Upper chest: Visualized upper chest demonstrates no acute finding. 4 mm nodular density along the right major fissure favored to reflect a small intra pulmonic lymph node. Review of the MIP images confirms the above findings CTA HEAD FINDINGS Anterior circulation: Petrous segments patent bilaterally. Scattered atheromatous plaque within the carotid siphons with no more than mild multifocal stenosis. A1 segments patent bilaterally. Normal anterior communicating artery complex. Anterior cerebral arteries patent to their distal aspects without stenosis. Left M1 patent proximally. Focal moderate proximal M2 stenosis, superior division (series 9, image 54). Left MCA branches perfused distally. Right M1 segment patent without significant stenosis. Normal right MCA bifurcation. Distal right MCA branches well perfused. Posterior circulation: Both V4 segments patent to the vertebrobasilar junction without stenosis. Neither PICA well visualized. Focal non stenotic plaque noted at the vertebrobasilar junction. Basilar widely patent distally to its distal aspect. Superior cerebellar arteries patent bilaterally. Both PCAs primarily supplied via the basilar. Short-segment moderate proximal P2 stenosis on the left (series 9, image 54). Additional scattered atheromatous irregularity within the PCAs without high-grade stenosis. Venous sinuses:  Grossly patent allowing for timing the contrast bolus. Anatomic variants: None significant.  No aneurysm. Review of the MIP images confirms the above findings CT Brain Perfusion Findings: ASPECTS: 10. CBF (<30%) Volume: 062mPerfusion (Tmax>6.0s) volume: 74m67mismatch Volume: 74mL674mfarction Location:Negative CT perfusion with no evidence for acute core infarct or other perfusion deficit. IMPRESSION: 1. Negative CTA for emergent large vessel occlusion. 2. Negative CT perfusion with no evidence for acute core infarct or other perfusion deficit. 3. Moderate atherosclerotic disease about the major arterial vasculature of the head and neck including moderate proximal left M2 and P2 stenoses. Critical Value/emergent results were called by telephone at the time of interpretation on 03/27/2020 at 8:35 pm to provider [facility]IMethod[facility] Hospitalho verbally acknowledged these results. Electronically Signed   By: BenjJeannine Boga.   On: 03/27/2020 21:08    EKG: Independently reviewed.  None in ed.  ECHO: 04/18/2018: IMPRESSIONS  1. The left ventricle has normal systolic function, with an ejection  fraction of 55-60%. The cavity size was normal. Left ventricular diastolic  Doppler parameters are consistent with impaired relaxation.  2. The right ventricle has normal systolic function. The cavity was  normal. There is no increase in right ventricular wall thickness.  3. No evidence present in the left atrial appendage.  4. The aortic valve is tricuspid. Aortic valve regurgitation was not  assessed by color flow Doppler.  5. No pulmonic valve vegetation visualized.    Assessment/Plan Principal Problem:   Weakness of extremity Active Problems:   Sepsis ([place])   HTN (hypertension), benign   Hyperlipidemia   H/O: CVA (cerebrovascular accident)   DM II (diabetes mellitus, type II), controlled ([place])   Weakness of RUE: D/d include cva or generalized weakness or lethargic due to sepsis and dehydration. We  will continue pt on her home regimen of asa/plavix  Per neurology recommendation we will f/u on CTA.  .  Stroke/Telemetry Floor     .  Neuro Checks     .  Bedside Swallow Eval     .  DVT Prophylaxis     .  IV Fluids, Normal Saline     .  Head of Bed 30 Degrees     .  Euglycemia and Avoid Hyperthermia (PRN Acetaminophen)     .  continue asa and plavix/ add statin.     .  workup for toxic/infectious/metabolic causes of unmasking old stroke symptoms.     .  f/u CTA and perfusion results. I will addend my note as needed. I think pt is lethargic due to sepsis and not due to an acute cva.   Sepsis : Attribute to Influenza A; Start pt on Tamiflu 75 mg bid. Supportive care with tylenol for fever and cough.  HTN; Blood pressure (!) 146/79, pulse 98, temperature (!) 101.3 F (38.5 C), temperature source Oral, resp. rate (!) 36, height '5\' 7"'  (1.702 m), weight 72.6 kg, SpO2 92 %. Pt is on amlodipine 5 mg and due to less likely this is a cva we will restart amlodipine and goal SBP is less than 220, and around 150's-160's.  Hyperlipidemia: Pt is on crestopr at home and we will continue crestor at 40 mg.  Am lipid panel.   H/O CVA: - cont DAPT and statin.   DMII: -ssi/ accuchecks/a1c -carb consistent diet once pt is abel to take po and passes swallow.   AKI: Pt's creatinine today is 1.07 and last one was normal as below. Lab Results  Component Value Date   CREATININE 1.07 (H) 03/27/2020   CREATININE 0.64 07/06/2018   CREATININE 0.69 04/18/2018  we will continue with ivf hydration and follow.   DVT prophylaxis:  Heparin  Code Status:  Full code   Family Communication:  Pennye, Beeghly (Daughter)  [phone] (Mobile)  Disposition Plan:  TBD   Consults called:  Teleneurology.   Admission status: Inpatient   Para Skeans MD [facility] [phone] How to contact the [facility] Attending or Consulting provider [place] or covering provider during after hours [place],  for this patient?    1. Check the care team in [facility] and look for a) attending/consulting [facility] provider listed and b) the [facility] team listed 2. Log into [email or web address] and use [place]'s universal password to access. If you do not have the password, please contact the hospital operator. 3. Locate the [facility] provider you are looking for under [facility] and page to a number that you can be directly reached. 4. If you still have difficulty reaching the provider, please page the [facility] (Director on Call) for the Hospitalists listed on  amion for assistance. [email or web address] Password [email or web address] 03/27/2020, 10:14 PM

## 2020-03-27 NOTE — Consult Note (Addendum)
[facility] TeleNeurology Consult Services   Date of Service:   03/27/2020 20:12:07  Diagnosis:     .  I69.4 - Sequelae of stroke, not specified as haemorrhage or infarction  Impression: I suspect unmasking of old stroke symptoms with R sided deficits possibly due to infection and poor PO intake. NIHSS is 7 (reflects mostly chronic symptoms). CTA is pending. Not a candidate for IV thrombolysis due to duration of symptoms. Recommend MRI of the brain to rule out stroke, then workup infection/toxic/metabolic causes of unmasking.  Metrics: Last Known Well: 03/27/2020 08:00:00 [facility] Notification Time: 03/27/2020 20:12:07 Arrival Time: 03/27/2020 19:37:00 Stamp Time: 03/27/2020 20:12:07 Initial Response Time: 03/27/2020 20:16:40 Symptoms: increased R weakness, facial droop. NIHSS Start Assessment Time: 03/27/2020 20:20:40 Patient is not a candidate for Thrombolytic. Thrombolytic Medical Decision: 03/27/2020 20:22:40 Patient was not deemed candidate for Thrombolytic because of following reasons: Last Well Known Above 4.5 Hours. Other Diagnosis suspected.  CT head showed no acute hemorrhage or acute core infarct. CT head was reviewed and results were: chronic microvascular disease worse on the L.  ED Physician notified of diagnostic impression and management plan on 03/27/2020 20:43:57  Advanced Imaging: CTA Head and Neck Recommended:  CTP Recommended:   Our recommendations are outlined below.  Recommendations:      .  Stroke/Telemetry Floor     .  Neuro Checks     .  Bedside Swallow Eval     .  DVT Prophylaxis     .  IV Fluids, Normal Saline     .  Head of Bed 30 Degrees     .  Euglycemia and Avoid Hyperthermia (PRN Acetaminophen)     .  continue asa and plavix     .  workup for toxic/infectious/metabolic causes of unmasking old stroke symptoms.     .  f/u CTA and perfusion results. I will addend my note as needed.  Routine Consultation with  Inhouse Neurology for Follow up Care  Sign Out:     .  Discussed with Emergency Department Provider    ------------------------------------------------------------------------------  History of Present Illness: Patient is a 65 year old Female.  Patient was brought by private transportation with symptoms of increased R weakness, facial droop.  she has h/o stroke 2 years ago with R sided deficits. she presents with R weakness, facial droop, slurred speech, and dysoriented. She wears an ankle base that helps her walk and normally has no movement in the RUE. Also has R arm contracture. endorses a coughing a lot, sick with her grandson. LKW was at 0800 but she was in and out of bed all day and has not really had anything to eat per the daughter.  Last seen normal was beyond 4.5 hours of presentation.  Past Medical History:     . Hypertension     . Diabetes Mellitus     . Hyperlipidemia     . Stroke  Social History: Smoking: Former  Anticoagulant use:  No  Antiplatelet use: Yes asa and plavix  Allergies:  NKDA    Examination: BP(144/93), Pulse(116), Blood Glucose(131) 1A: Level of Consciousness - Arouses to minor stimulation + 1 1B: Ask Month and Age - 1 Question Right + 1 1C: Blink Eyes & Squeeze Hands - Performs Both Tasks + 0 2: Test Horizontal Extraocular Movements - Normal + 0 3: Test Visual Fields - Partial Hemianopia + 1 4: Test Facial Palsy (Use Grimace if Obtunded) - Normal symmetry + 0 5A: Test Left Arm Motor  Drift - No Drift for 10 Seconds + 0 5B: Test Right Arm Motor Drift - No Effort Against Gravity + 3 6A: Test Left Leg Motor Drift - No Drift for 5 Seconds + 0 6B: Test Right Leg Motor Drift - No Drift for 5 Seconds + 0 7: Test Limb Ataxia (FNF/Heel-Shin) - No Ataxia + 0 8: Test Sensation - Normal; No sensory loss + 0 9: Test Language/Aphasia - Normal; No aphasia + 0 10: Test Dysarthria - Mild-Moderate Dysarthria: Slurring but can be understood + 1 11: Test  Extinction/Inattention - No abnormality + 0  NIHSS Score: 7  Pre-Morbid Modified Rankin Scale: 4 Points = Moderately severe disability; unable to walk and attend to bodily needs without assistance   Patient/Family was informed the Neurology Consult would occur via TeleHealth consult by way of interactive audio and video telecommunications and consented to receiving care in this manner.   Patient is being evaluated for possible acute neurologic impairment and high probability of imminent or life-threatening deterioration. I spent total of 25 minutes providing care to this patient, including time for face to face visit via telemedicine, review of medical records, imaging studies and discussion of findings with providers, the patient and/or family.   Dr Lynnda Child   [facility] [phone]  Case [id number]  Addendum: Advanced Imaging:  CTA Head and Neck Completed.  CTP Completed.  LVO:No  Proceed with plan as above.

## 2020-03-27 NOTE — ED Provider Notes (Signed)
[facility] Emergency Department Provider Note  Time seen: 9:39 PM  I have reviewed the triage vital signs and the nursing notes.   HISTORY  Chief Complaint Stroke Symptoms   HPI Brandi Rojas is a 65 y.o. female the past medical history of diabetes, hypertension, CVA with right-sided deficits presents to the emergency department for increased right upper extremity weakness and fatigue.  According to EMS report family reports around 4 PM today the patient was unable to use her right upper extremity and was feeling very fatigued.  They state at baseline patient has right-sided deficits and can use her right upper extremity somewhat but not fully.  Upon arrival to the emergency department a code stroke was initiated however patient was then found to be febrile tachycardic and tachypneic meeting sepsis criteria.  Patient is awake alert and oriented.  Denies any vomiting or diarrhea.  Denies abdominal pain or chest pain.   Past Medical History:  Diagnosis Date  . Diabetes mellitus without complication ([place])   . DM II (diabetes mellitus, type II), controlled ([place]) 03/27/2020  . Hypertension   . Stroke [facility])     Patient Active Problem List   Diagnosis Date Noted  . H/O: CVA (cerebrovascular accident) 03/27/2020  . Weakness of extremity 03/27/2020  . Sepsis ([place]) 03/27/2020  . DM II (diabetes mellitus, type II), controlled ([place]) 03/27/2020  . HTN (hypertension), benign 05/23/2018  . Hyperlipidemia 05/23/2018  . Tobacco abuse 05/23/2018    Past Surgical History:  Procedure Laterality Date  . none      Prior to Admission medications   Medication Sig Start Date End Date Taking? Authorizing Provider  amLODipine (NORVASC) 5 MG tablet Take 1 tablet (5 mg total) by mouth daily. 04/19/18 04/19/19  Bettey Costa, MD  aspirin EC 81 MG EC tablet Take 1 tablet (81 mg total) by mouth daily. 04/19/18   Bettey Costa, MD  blood glucose meter kit and supplies KIT Dispense based on  patient and insurance preference. Use up to four times daily as directed. (FOR ICD-9 250.00, 250.01). 04/19/18   Bettey Costa, MD  clopidogrel (PLAVIX) 75 MG tablet Take 1 tablet (75 mg total) by mouth daily. 04/19/18   Bettey Costa, MD  insulin glargine (LANTUS) 100 UNIT/ML injection 10 units at night daily 04/19/18 04/19/19  Bettey Costa, MD  nicotine (NICODERM CQ - DOSED IN MG/24 HOURS) 21 mg/24hr patch Place 1 patch (21 mg total) onto the skin daily. 04/20/18   Bettey Costa, MD  rosuvastatin (CRESTOR) 40 MG tablet Take 1 tablet (40 mg total) by mouth at bedtime. 04/19/18 04/19/19  Bettey Costa, MD    No Known Allergies  Family History  Problem Relation Age of Onset  . CVA Neg Hx   . Hypertension Neg Hx     Social History Social History   Tobacco Use  . Smoking status: Former Smoker    Packs/day: 0.50    Types: Cigarettes    Quit date: 04/08/2018    Years since quitting: 1.9  . Smokeless tobacco: Never Used  Substance Use Topics  . Alcohol use: Not Currently    Alcohol/week: 7.0 standard drinks    Types: 7 Cans of beer per week    Comment: 2 40oz a day  . Drug use: No    Review of Systems Constitutional: Patient found to be febrile, unknown to patient. Cardiovascular: Negative for chest pain.  Respiratory: Negative for shortness of breath.  States 2 days of cough. Gastrointestinal: Negative for abdominal  pain, vomiting  Musculoskeletal: Negative for musculoskeletal complaints Neurological: Negative for headache.  Increase right upper extremity weakness compared to baseline. All other ROS negative  ____________________________________________   PHYSICAL EXAM:  VITAL SIGNS: ED Triage Vitals  Enc Vitals Group     BP 03/27/20 1946 (!) 144/93     Pulse Rate 03/27/20 1946 (!) 116     Resp 03/27/20 1946 18     Temp 03/27/20 1946 (!) 101.3 F (38.5 C)     Temp Source 03/27/20 1946 Oral     SpO2 03/27/20 1946 94 %     Weight 03/27/20 1947 160 lb (72.6 kg)     Height 03/27/20  1947 '5\' 7"'  (1.702 m)     Head Circumference --      Peak Flow --      Pain Score 03/27/20 1947 0     Pain Loc --      Pain Edu? --      Excl. in [place]? --     Constitutional: Alert and oriented. Well appearing and in no distress. Eyes: Normal exam ENT      Head: Normocephalic and atraumatic.      Mouth/Throat: Mucous membranes are moist. Cardiovascular: Regular rhythm rate around 120 bpm.  No obvious murmur. Respiratory: Moderate tachypnea with no obvious wheeze rales or rhonchi.  Occasional cough. Gastrointestinal: Soft and nontender. No distention. Musculoskeletal: Nontender with normal range of motion in all extremities.  Neurologic:  Normal speech and language. No gross focal neurologic deficits Skin:  Skin is warm, dry and intact.  Psychiatric: Mood and affect are normal.   ____________________________________________   RADIOLOGY  CT negative for acute abnormality. CT perfusion negative for acute abnormality.  ____________________________________________   INITIAL IMPRESSION / ASSESSMENT AND PLAN / ED COURSE  Pertinent labs & imaging results that were available during my care of the patient were reviewed by me and considered in my medical decision making (see chart for details).   Patient presents emergency department for increased right upper extremity weakness compared to baseline.  Patient now found to be febrile tachycardic and tachypneic meeting sepsis criteria.  Patient had a code stroke called in triage.  I met the patient in the room she is awake alert and oriented but does state increased right upper extremity weakness compared to baseline.  Given the patient's sepsis criteria we will check blood cultures lactic labs COVID/flu chest x-ray.  CT scan head is negative.  Discussed with telemetry neurology who recommends CT perfusion.  CT perfusion performed and is negative.  Patient's lab work is come back showing a normal white blood cell count but the patient has  tested positive for influenza A.  This likely explains the patient's increased fatigue as well as exacerbation of prior stroke symptoms.  Given the patient's weakness fatigue tachycardia and tachypnea we will admit to the hospital service for continued work-up.  I have canceled the initially ordered antibiotics given likely viral source.  Ronnisha Felber was evaluated in Emergency Department on 03/27/2020 for the symptoms described in the history of present illness. She was evaluated in the context of the global COVID-19 pandemic, which necessitated consideration that the patient might be at risk for infection with the SARS-CoV-2 virus that causes COVID-19. Institutional protocols and algorithms that pertain to the evaluation of patients at risk for COVID-19 are in a state of rapid change based on information released by regulatory bodies including the CDC and federal and state organizations. These policies and algorithms were followed during  the patient's care in the ED.  CRITICAL CARE Performed by: Harvest Dark   Total critical care time: 30 minutes  Critical care time was exclusive of separately billable procedures and treating other patients.  Critical care was necessary to treat or prevent imminent or life-threatening deterioration.  Critical care was time spent personally by me on the following activities: development of treatment plan with patient and/or surrogate as well as nursing, discussions with consultants, evaluation of patient's response to treatment, examination of patient, obtaining history from patient or surrogate, ordering and performing treatments and interventions, ordering and review of laboratory studies, ordering and review of radiographic studies, pulse oximetry and re-evaluation of patient's condition. ____________________________________________   FINAL CLINICAL IMPRESSION(S) / ED DIAGNOSES  Sepsis Influenza A Weakness   Harvest Dark, MD 03/27/20 2144

## 2020-03-27 NOTE — ED Notes (Signed)
Dr. Imogene Burn with teleneurology on stroke cart at this time to speak with pt.

## 2020-03-27 NOTE — Progress Notes (Signed)
   03/27/20 2000  Clinical Encounter Type  Visited With Patient and family together  Visit Type Initial;Spiritual support  Referral From Nurse  Consult/Referral To Chaplain  Spiritual Encounters  Spiritual Needs Prayer;Emotional  Chaplain Nolita Kutter responded to a Code Stroke, ED-2 Pt Ms. Brandi Rojas. Pt was brought into the ED via POV by her daughter. Pt was being rolled to CT upon my arrival. Pt was sitting up on the stretcher. I asked the medical staff did she have any family members present, he said yes her daughter is room 2. I advised them that I would go and meet the daughter in room 2. I provided a ministry of presence, words of comfort and encouragement. I informed the daughter Chaplain services are available if they need [place].

## 2020-03-27 NOTE — Consult Note (Signed)
PHARMACY -  BRIEF ANTIBIOTIC NOTE   Pharmacy has received consult(s) for vancomycin and cefepime from an ED provider.  The patient's profile has been reviewed for ht/wt/allergies/indication/available labs.    One time order(s) placed for cefepime 2g and vancomycin 1750 mg  Further antibiotics/pharmacy consults should be ordered by admitting physician if indicated.                       Thank you, Derrek Gu 03/27/2020  8:19 PM

## 2020-03-27 NOTE — ED Notes (Signed)
Patient transported to CT by this RN 

## 2020-03-28 ENCOUNTER — Inpatient Hospital Stay: Payer: BLUE CROSS/BLUE SHIELD

## 2020-03-28 ENCOUNTER — Encounter: Payer: Self-pay | Admitting: Internal Medicine

## 2020-03-28 ENCOUNTER — Other Ambulatory Visit: Payer: Self-pay

## 2020-03-28 DIAGNOSIS — R29898 Other symptoms and signs involving the musculoskeletal system: Secondary | ICD-10-CM | POA: Diagnosis not present

## 2020-03-28 DIAGNOSIS — Z8673 Personal history of transient ischemic attack (TIA), and cerebral infarction without residual deficits: Secondary | ICD-10-CM | POA: Diagnosis not present

## 2020-03-28 LAB — URINALYSIS, COMPLETE (UACMP) WITH MICROSCOPIC
Bilirubin Urine: NEGATIVE
Glucose, UA: NEGATIVE mg/dL
Ketones, ur: NEGATIVE mg/dL
Leukocytes,Ua: NEGATIVE
Nitrite: NEGATIVE
Protein, ur: NEGATIVE mg/dL
Specific Gravity, Urine: 1.005 (ref 1.005–1.030)
pH: 6 (ref 5.0–8.0)

## 2020-03-28 LAB — BLOOD CULTURE ID PANEL (REFLEXED) - BCID2

## 2020-03-28 LAB — GLUCOSE, CAPILLARY
Glucose-Capillary: 89 mg/dL (ref 70–99)
Glucose-Capillary: 124 mg/dL — ABNORMAL HIGH (ref 70–99)
Glucose-Capillary: 92 mg/dL (ref 70–99)
Glucose-Capillary: 131 mg/dL — ABNORMAL HIGH (ref 70–99)

## 2020-03-28 LAB — URINE DRUG SCREEN, QUALITATIVE (ARMC ONLY)
Amphetamines, Ur Screen: NOT DETECTED
Barbiturates, Ur Screen: NOT DETECTED
Benzodiazepine, Ur Scrn: NOT DETECTED
Cannabinoid 50 Ng, Ur ~~LOC~~: NOT DETECTED
Cocaine Metabolite,Ur ~~LOC~~: NOT DETECTED
MDMA (Ecstasy)Ur Screen: NOT DETECTED
Methadone Scn, Ur: NOT DETECTED
Opiate, Ur Screen: NOT DETECTED
Phencyclidine (PCP) Ur S: NOT DETECTED
Tricyclic, Ur Screen: NOT DETECTED

## 2020-03-28 LAB — LIPID PANEL
Cholesterol: 95 mg/dL (ref 0–200)
HDL: 34 mg/dL — ABNORMAL LOW (ref >40)
LDL Cholesterol: 44 mg/dL (ref 0–99)
Total CHOL/HDL Ratio: 2.8 RATIO
Triglycerides: 85 mg/dL (ref <150)
VLDL: 17 mg/dL (ref 0–40)

## 2020-03-28 LAB — HEMOGLOBIN A1C
Hgb A1c MFr Bld: 6.8 % — ABNORMAL HIGH (ref 4.8–5.6)
Mean Plasma Glucose: 148.46 mg/dL

## 2020-03-28 LAB — HIV ANTIBODY (ROUTINE TESTING W REFLEX): HIV Screen 4th Generation wRfx: NONREACTIVE

## 2020-03-28 LAB — CK: Total CK: 612 U/L — ABNORMAL HIGH (ref 38–234)

## 2020-03-28 LAB — VITAMIN B12: Vitamin B-12: 334 pg/mL (ref 180–914)

## 2020-03-28 IMAGING — MR MR HEAD W/O CM
10 of 11 series · 40 of 48 positions shown · non-contrast
Comparison: Head CT [DATE].

CLINICAL DATA: Stroke follow-up.

EXAM:
MRI HEAD WITHOUT CONTRAST
TECHNIQUE: Multiplanar, multiecho pulse sequences of the brain and surrounding
structures were obtained without intravenous contrast.

[Series 5: ax dwi_tracew · axial · 3.0mm · 0.65mm/px · z∈[-111,+39]mm · 6 of 48 slices shown]
[im 1/48]
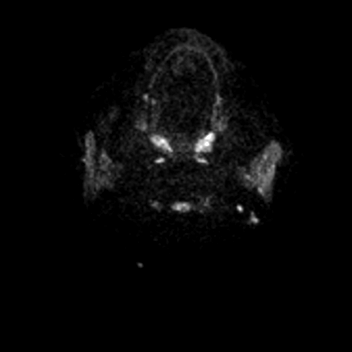
[im 10/48]
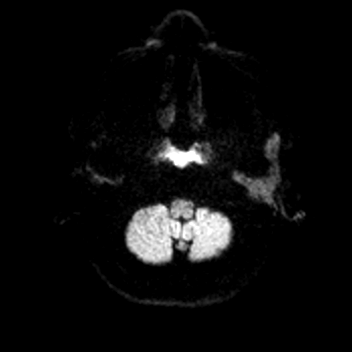
[im 19/48]
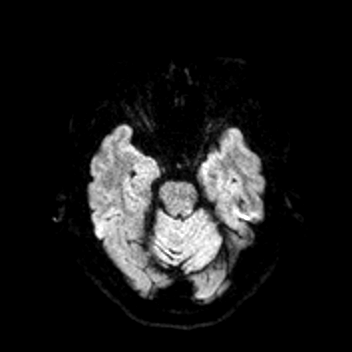
[im 29/48]
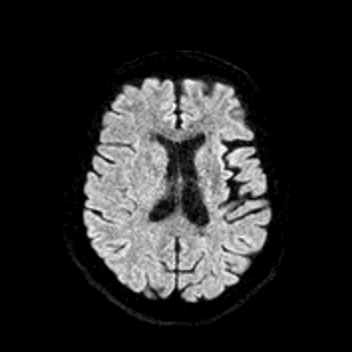
[im 38/48]
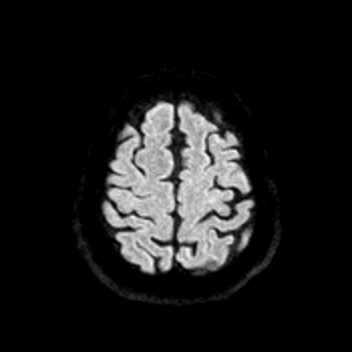
[im 48/48]
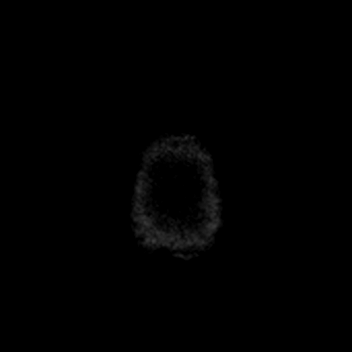

[Series 6: ax dwi_adc · axial · 3.0mm · 0.65mm/px · z∈[-111,+39]mm · 5 of 48 slices shown]
[im 1/48]
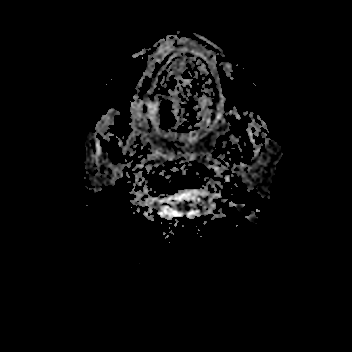
[im 12/48]
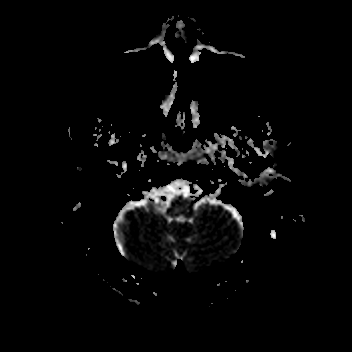
[im 24/48]
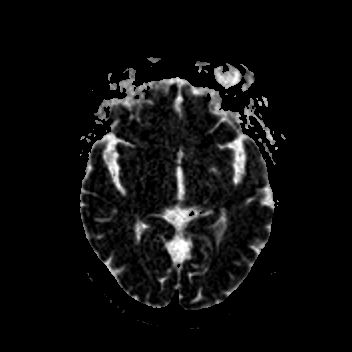
[im 36/48]
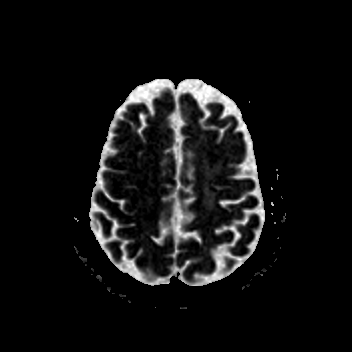
[im 48/48]
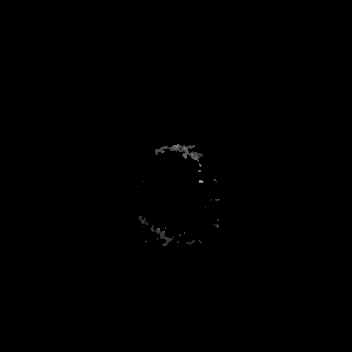

[Series 7: cor dwi_tracew · coronal · 5.0mm · 0.65mm/px · 5 of 40 slices shown]
[im 1/40]
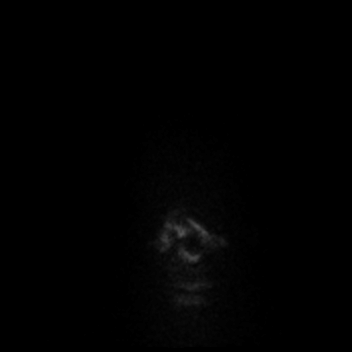
[im 10/40]
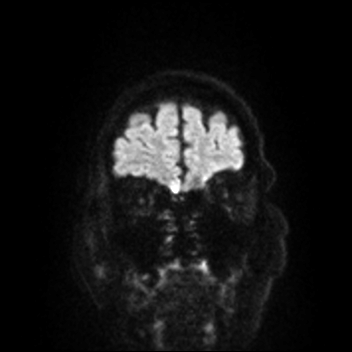
[im 20/40]
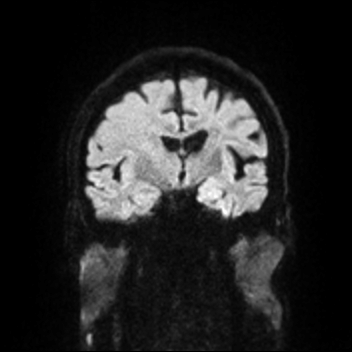
[im 30/40]
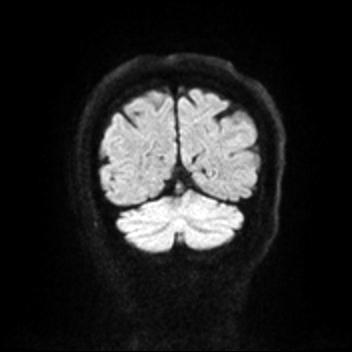
[im 40/40]
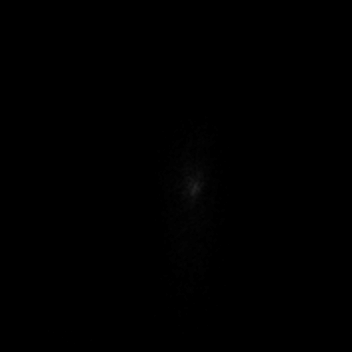

[Series 8: cor dwi_adc · coronal · 5.0mm · 0.65mm/px · 4 of 38 slices shown]
[im 1/38]
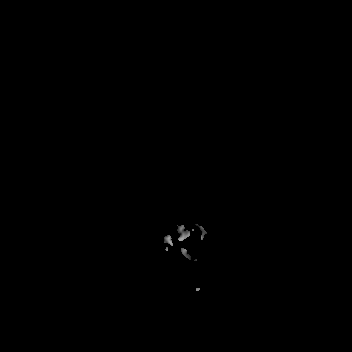
[im 13/38]
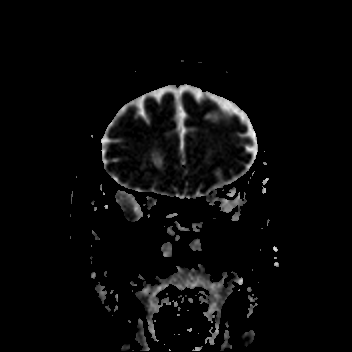
[im 25/38]
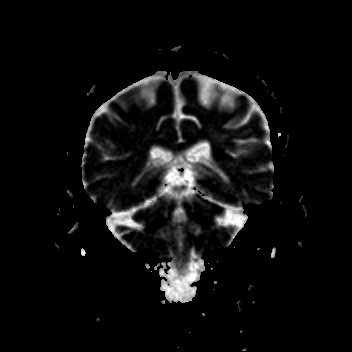
[im 38/38]
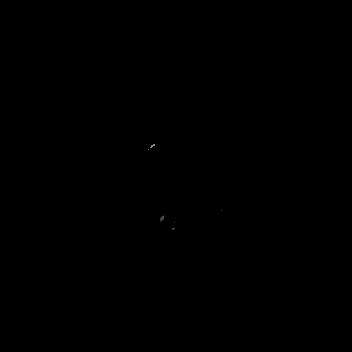

[Series 9: T1 · sagittal · 5.0mm · 0.62mm/px · 2 of 21 slices shown (1 of 2)]
[im 1/21]
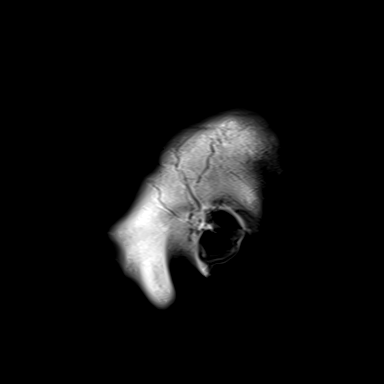
[im 21/21]
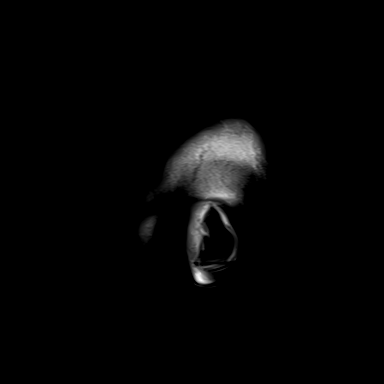

[Series 11: pha_images · axial · 3.0mm · 0.90mm/px · z∈[-115,+4]mm · 5 of 53 slices shown]
[im 1/53]
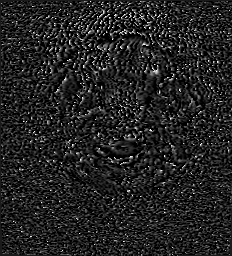
[im 11/53]
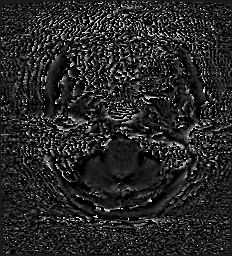
[im 21/53]
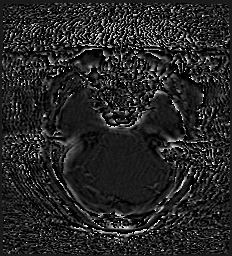
[im 32/53]
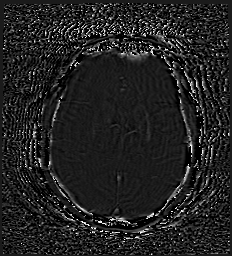
[im 42/53]
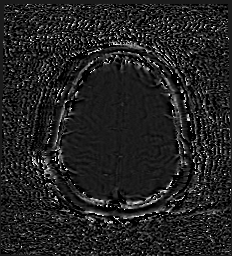

[Series 14: FLAIR · axial · 5.0mm · 1.20mm/px · z∈[-111,+40]mm · 3 of 27 slices shown]
[im 1/27]
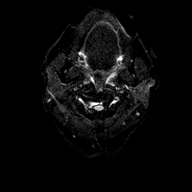
[im 14/27]
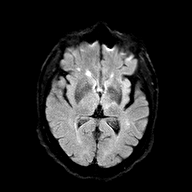
[im 27/27]
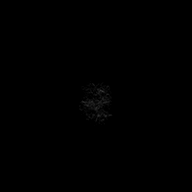

[Series 15: T2 · axial · 5.0mm · 0.45mm/px · z∈[-111,+40]mm · 3 of 27 slices shown (1 of 2)]
[im 1/27]
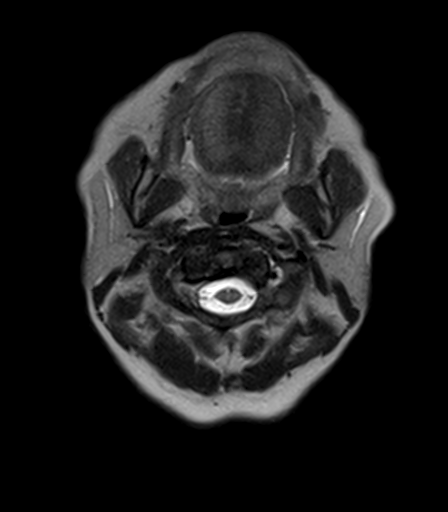
[im 14/27]
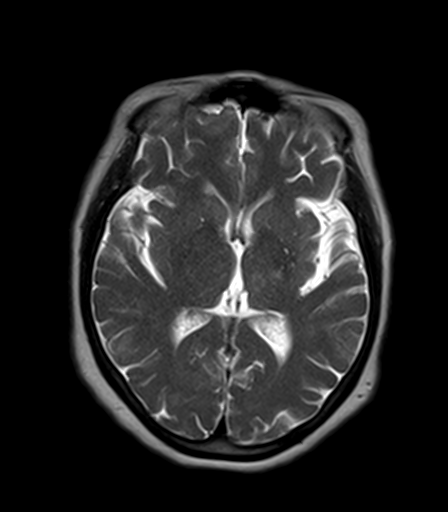
[im 27/27]
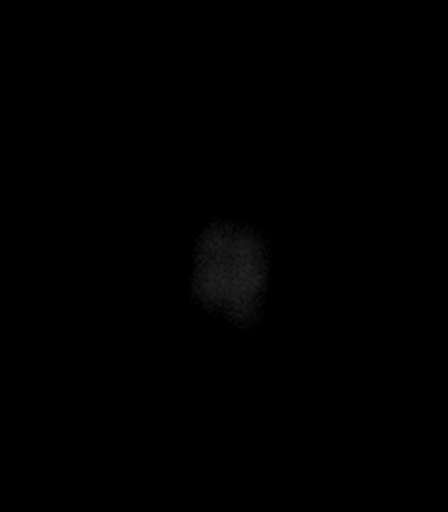

[Series 16: T1 · axial · 5.0mm · 0.90mm/px · z∈[-111,+40]mm · 3 of 27 slices shown (2 of 2)]
[im 1/27]
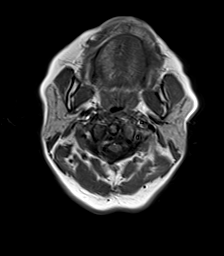
[im 14/27]
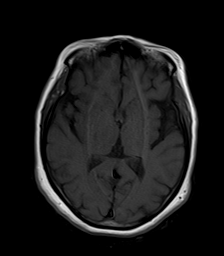
[im 27/27]
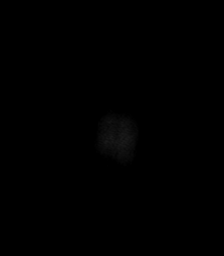

[Series 17: T2 · coronal · 5.0mm · 0.45mm/px · 4 of 31 slices shown (2 of 2)]
[im 1/31]
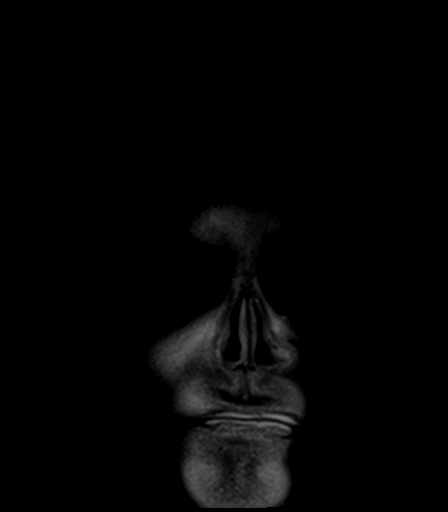
[im 11/31]
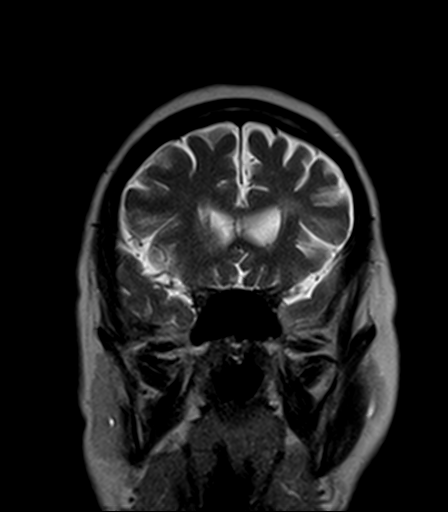
[im 21/31]
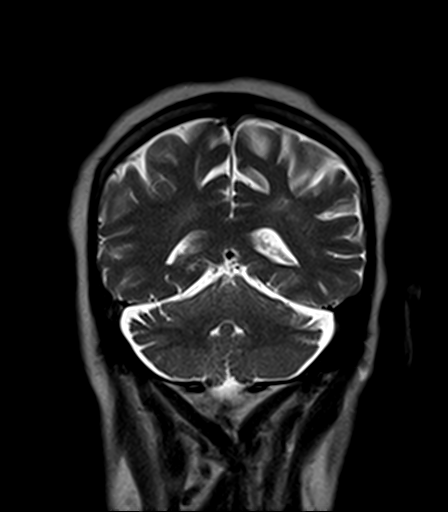
[im 31/31]
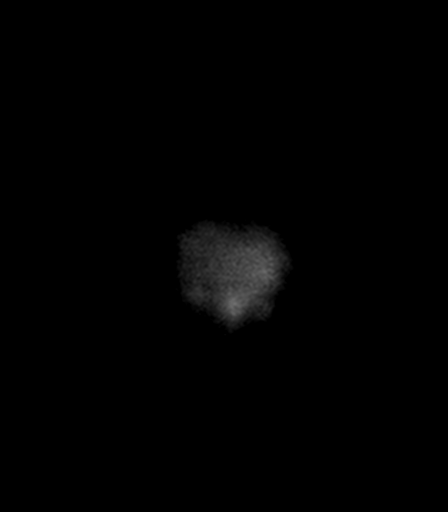

[40 of 48 positions shown; findings below may reference images not displayed]

FINDINGS: Brain: No acute infarction, hemorrhage, hydrocephalus, extra-axial
collection or mass lesion. Remote watershed infarcts along the deep
white matter of left frontal lobe and basal ganglia. Decreased
volume of the left cerebral peduncle with increased T2 signal along
the corresponding cortical spinal tract is likely secondary to
wallerian degeneration. Scattered foci of T2 hyperintensity are seen
within the white matter of the cerebral hemispheres, nonspecific,
most likely related to chronic small vessel ischemia.

Vascular: Normal flow voids.

Skull and upper cervical spine: Normal marrow signal.

Sinuses/Orbits: Negative.
IMPRESSION: 1. No acute intracranial abnormality.
2. Remote watershed infarcts along the deep white matter of the left
frontal lobe and basal ganglia.
3. Mild chronic small vessel ischemia.

## 2020-03-28 MED ORDER — OSELTAMIVIR PHOSPHATE 75 MG PO CAPS: 75.0000 mg | ORAL_CAPSULE | Freq: Two times a day (BID) | ORAL | 0 refills | Status: AC

## 2020-03-28 MED ORDER — ROSUVASTATIN CALCIUM 40 MG PO TABS: ORAL_TABLET | ORAL | 0 refills | Status: DC

## 2020-03-28 MED ORDER — ROSUVASTATIN CALCIUM 20 MG PO TABS
40.0000 mg | ORAL_TABLET | Freq: Every day | ORAL | Status: DC
  Administered 2020-03-28: 15:00:00 40 mg via ORAL
  Filled 2020-03-28: qty 2

## 2020-03-28 MED ORDER — ROSUVASTATIN CALCIUM 40 MG PO TABS: 40.0000 mg | ORAL_TABLET | Freq: Every day | ORAL | 0 refills | Status: AC

## 2020-03-28 MED ORDER — ROSUVASTATIN CALCIUM 40 MG PO TABS: ORAL_TABLET | ORAL | 11 refills | Status: DC

## 2020-03-28 MED ORDER — ROSUVASTATIN CALCIUM 40 MG PO TABS: 40.0000 mg | ORAL_TABLET | Freq: Every day | ORAL | Status: AC

## 2020-03-28 NOTE — ED Notes (Signed)
Initial ve as pt arrived on the unit from ED  03/28/20 0001  Assess: MEWS Score  Temp 98.3 F (36.8 C)  BP 117/78  Pulse Rate 78  ECG Heart Rate 78  Resp (!) 24  SpO2 96 %  O2 Device Room Air  Assess: MEWS Score  MEWS Temp 0  MEWS Systolic 0  MEWS Pulse 0  MEWS RR 1  MEWS LOC 0  MEWS Score 1  MEWS Score Color Green

## 2020-03-28 NOTE — Consult Note (Signed)
PHARMACY - PHYSICIAN COMMUNICATION CRITICAL VALUE ALERT - BLOOD CULTURE IDENTIFICATION (BCID)  Brandi Rojas is an 65 y.o. female who presented to [facility] on 03/27/2020 with a chief complaint of weakness of extremity  Assessment:  BCID report: Growth of gram positive cocci in 1/4 bottles (aerobic).  Results: Staph species, staph epidermidis isolated.  No resistance patterns noted.  Pt was discharged today on no antibiotics.  This culture result is highly suspicious for contamination. Would recommend followup with patient to confirm no continuation of symtoms.  Name of physician (or Provider) Contacted: Irena Cords  Current antibiotics: received vancomycin and metronidazole as inpt but discharged on no abx.  Changes to prescribed antibiotics recommended:  Patient is on recommended antibiotics - No changes needed  Results for orders placed or performed during the hospital encounter of 03/27/20  Blood Culture ID Panel (Reflexed) (Collected: 03/27/2020  8:47 PM)  Result Value Ref Range   Enterococcus faecalis NOT DETECTED NOT DETECTED   Enterococcus Faecium NOT DETECTED NOT DETECTED   Listeria monocytogenes NOT DETECTED NOT DETECTED   Staphylococcus species DETECTED (A) NOT DETECTED   Staphylococcus aureus (BCID) NOT DETECTED NOT DETECTED   Staphylococcus epidermidis DETECTED (A) NOT DETECTED   Staphylococcus lugdunensis NOT DETECTED NOT DETECTED   Streptococcus species NOT DETECTED NOT DETECTED   Streptococcus agalactiae NOT DETECTED NOT DETECTED   Streptococcus pneumoniae NOT DETECTED NOT DETECTED   Streptococcus pyogenes NOT DETECTED NOT DETECTED   A.calcoaceticus-baumannii NOT DETECTED NOT DETECTED   Bacteroides fragilis NOT DETECTED NOT DETECTED   Enterobacterales NOT DETECTED NOT DETECTED   Enterobacter cloacae complex NOT DETECTED NOT DETECTED   Escherichia coli NOT DETECTED NOT DETECTED   Klebsiella aerogenes NOT DETECTED NOT DETECTED   Klebsiella oxytoca NOT DETECTED NOT  DETECTED   Klebsiella pneumoniae NOT DETECTED NOT DETECTED   Proteus species NOT DETECTED NOT DETECTED   Salmonella species NOT DETECTED NOT DETECTED   Serratia marcescens NOT DETECTED NOT DETECTED   Haemophilus influenzae NOT DETECTED NOT DETECTED   Neisseria meningitidis NOT DETECTED NOT DETECTED   Pseudomonas aeruginosa NOT DETECTED NOT DETECTED   Stenotrophomonas maltophilia NOT DETECTED NOT DETECTED   Candida albicans NOT DETECTED NOT DETECTED   Candida auris NOT DETECTED NOT DETECTED   Candida glabrata NOT DETECTED NOT DETECTED   Candida krusei NOT DETECTED NOT DETECTED   Candida parapsilosis NOT DETECTED NOT DETECTED   Candida tropicalis NOT DETECTED NOT DETECTED   Cryptococcus neoformans/gattii NOT DETECTED NOT DETECTED   Methicillin resistance mecA/C NOT DETECTED NOT DETECTED    Rico Junker 03/28/2020  6:31 PM

## 2020-03-28 NOTE — Plan of Care (Signed)
Pt MRI negative for acute infarct. PT/OT recommend home health. She can be discharged from our standpoint. Will continue her home ASA and plavix. Given her CK 621, will hold off crestor for now. Recommend to recheck CK in one week with PCP, if normalized, can restart crestor. No specific neuro follow up needed at this time.  Marvel Plan, MD PhD Stroke Neurology 03/28/2020 4:31 PM

## 2020-03-28 NOTE — Discharge Summary (Signed)
Physician Discharge Summary   Brandi Rojas  female DOB: 1955/06/06  GLO:[id number]  PCP: Danae Orleans, MD  Admit date: 03/27/2020 Discharge date: 03/28/2020  Admitted From: home Disposition:  Home Daughter updated on the phone about discharge plans prior to discharge.  Home Health: Yes CODE STATUS: Full code  Discharge Instructions    Discharge instructions   Complete by: As directed    You don't have a new stroke.  Your symptoms may be from recurrence of your old stroke.    We think your mild muscle injury may be due to lying on hard surface for long periods of time.  Please follow up with your outpatient doctor in 1 week to check your muscle enzyme CK to make sure it's improving.     Dr. Enzo Bi - -      70 Day Unplanned Readmission Risk Score   Flowsheet Row ED to Hosp-Admission (Current) from 03/27/2020 in [facility] (1C)  30 Day Unplanned Readmission Risk Score (%) 9.02 Filed at 03/28/2020 1600     This score is the patient's risk of an unplanned readmission within 30 days of being discharged (0 -100%). The score is based on dignosis, age, lab data, medications, orders, and past utilization.   Low:  0-14.9   Medium: 15-21.9   High: 22-29.9   Extreme: 30 and above         Hospital Course:  For full details, please see H&P, progress notes, consult notes and ancillary notes.  Briefly,  Brandi Rojas is a 65 y.o. female brought from home by ems called by family member for weakness and altered mental status.    Pt has h/o cva and therefore code stroke called in ed and per teleneurology pt to cont with mri and complete stroke workup.    Worsening Weakness of RUE 2/2 recurrence of old stroke CT head no acute finding. CTA head and neck showed chronic left MCA and PCA stenosis.  MRI brain neg for new stroke.  Neuro consulted and believed Pt condition most likely recurrent of her old stroke in the setting of flu, fever, dehydration.  Pt  continued on home DAPT and statin.    Sepsis, ruled out Pt had fever due to Flu.  Not septic.  Influenza A Mild symptoms, no hypoxia.  Pt started on Tamiflu 75 mg bid which was continued on discharge.  Mild rhabdo presumed tramatic, POA CK 612 on presentation.  Daughter said that pt often lies on the sofa for hours, and the sofa has a hard surface.  Mild rhabdo most likely due to lying on hard surface for prolonged period of time.  Daughter said she will buy some soft pillows.  Daughter advised to take pt for PCP followup 1 week after discharge to ensure CK trending down.  Stain continued since pt needs it for stroke prevention.  HTN; Continued home amlodipine.  Hyperlipidemia: Continued home statin.  H/O CVA: - cont DAPT and statin.   DMII, well controlled A1c 6.8. Continued on home regimen.  AKI: On presention, creatinine 1.07.  Recent baseline around 0.6.  Likely due to poor hydration from being sick with the Flu.  Pt received IVF hydration.   Discharge Diagnoses:  Principal Problem:   Weakness of extremity Active Problems:   HTN (hypertension), benign   Hyperlipidemia   H/O: CVA (cerebrovascular accident)   Sepsis ([place])   DM II (diabetes mellitus, type II), controlled ([place])    Discharge Instructions:  Allergies as of  03/28/2020   No Known Allergies     Medication List    TAKE these medications   amLODipine 5 MG tablet Commonly known as: NORVASC Take 1 tablet (5 mg total) by mouth daily.   amLODipine 5 MG tablet Commonly known as: NORVASC Take 1 tablet by mouth daily.   aspirin 81 MG EC tablet Take 1 tablet (81 mg total) by mouth daily.   baclofen 10 MG tablet Commonly known as: LIORESAL Take 1-2 tablets by mouth daily as needed for muscle spasms.   blood glucose meter kit and supplies Kit Dispense based on patient and insurance preference. Use up to four times daily as directed. (FOR ICD-9 250.00, 250.01).   clopidogrel 75 MG tablet Commonly  known as: PLAVIX Take 1 tablet (75 mg total) by mouth daily.   insulin glargine 100 UNIT/ML injection Commonly known as: LANTUS 10 units at night daily What changed: Another medication with the same name was removed. Continue taking this medication, and follow the directions you see here.   oseltamivir 75 MG capsule Commonly known as: TAMIFLU Take 1 capsule (75 mg total) by mouth 2 (two) times daily for 4 days. For flu treatment.   rosuvastatin 40 MG tablet Commonly known as: CRESTOR Take 1 tablet (40 mg total) by mouth daily. What changed: when to take this   rosuvastatin 40 MG tablet Commonly known as: CRESTOR Take 1 tablet (40 mg total) by mouth daily. What changed: when to take this        Follow-up Information    Danae Orleans, MD In 1 week.   Specialty: Internal Medicine Why: patient to make on appointment due to office being close. Contact information: [place] [id number] [phone]               No Known Allergies   The results of significant diagnostics from this hospitalization (including imaging, microbiology, ancillary and laboratory) are listed below for reference.   Consultations:   Procedures/Studies: MR BRAIN WO CONTRAST  Result Date: 03/28/2020 CLINICAL DATA:  Stroke follow-up. EXAM: MRI HEAD WITHOUT CONTRAST TECHNIQUE: Multiplanar, multiecho pulse sequences of the brain and surrounding structures were obtained without intravenous contrast. COMPARISON:  Head CT March 27, 2020. FINDINGS: Brain: No acute infarction, hemorrhage, hydrocephalus, extra-axial collection or mass lesion. Remote watershed infarcts along the deep white matter of left frontal lobe and basal ganglia. Decreased volume of the left cerebral peduncle with increased T2 signal along the corresponding cortical spinal tract is likely secondary to wallerian degeneration. Scattered foci of T2 hyperintensity are seen within the white matter of  the cerebral hemispheres, nonspecific, most likely related to chronic small vessel ischemia. Vascular: Normal flow voids. Skull and upper cervical spine: Normal marrow signal. Sinuses/Orbits: Negative. IMPRESSION: 1. No acute intracranial abnormality. 2. Remote watershed infarcts along the deep white matter of the left frontal lobe and basal ganglia. 3. Mild chronic small vessel ischemia. Electronically Signed   By: Pedro Earls M.D.   On: 03/28/2020 14:16   CT CEREBRAL PERFUSION W CONTRAST  Result Date: 03/27/2020 CLINICAL DATA:  Initial evaluation for acute stroke, increased left-sided weakness. EXAM: CT ANGIOGRAPHY HEAD AND NECK CT PERFUSION BRAIN TECHNIQUE: Multidetector CT imaging of the head and neck was performed using the standard protocol during bolus administration of intravenous contrast. Multiplanar CT image reconstructions and MIPs were obtained to evaluate the vascular anatomy. Carotid stenosis measurements (when applicable) are obtained utilizing NASCET criteria, using the distal internal carotid diameter  as the denominator. Multiphase CT imaging of the brain was performed following IV bolus contrast injection. Subsequent parametric perfusion maps were calculated using RAPID software. CONTRAST:  136m OMNIPAQUE IOHEXOL 350 MG/ML SOLN COMPARISON:  Prior head CT from earlier the same day. FINDINGS: CTA NECK FINDINGS Aortic arch: Visualized aortic arch of normal caliber with normal 3 vessel morphology. Mild atheromatous change about the arch and origin of the great vessels without hemodynamically significant stenosis. Visualized subclavian arteries widely patent. Right carotid system: Left carotid system: Right common carotid artery patent from its origin to the bifurcation without stenosis. Mild atheromatous change about the right bifurcation/proximal right ICA without significant stenosis. Right ICA patent distally without stenosis, dissection or occlusion. Vertebral arteries: Left  common carotid artery patent from its origin to the bifurcation. Eccentric calcified plaque at the left carotid bulb/proximal left ICA without significant stenosis. Left ICA patent distally without stenosis, dissection or occlusion. Skeleton: Both vertebral arteries arise from subclavian arteries. No proximal subclavian artery stenosis. Right vertebral artery dominant. Vertebral arteries patent within the neck without stenosis, dissection or occlusion. Other neck: No visible acute osseous finding. No discrete or worrisome osseous lesions. Patient is edentulous. Soft tissues: No other acute soft tissue abnormality within the neck. No mass or adenopathy. Upper chest: Visualized upper chest demonstrates no acute finding. 4 mm nodular density along the right major fissure favored to reflect a small intra pulmonic lymph node. Review of the MIP images confirms the above findings CTA HEAD FINDINGS Anterior circulation: Petrous segments patent bilaterally. Scattered atheromatous plaque within the carotid siphons with no more than mild multifocal stenosis. A1 segments patent bilaterally. Normal anterior communicating artery complex. Anterior cerebral arteries patent to their distal aspects without stenosis. Left M1 patent proximally. Focal moderate proximal M2 stenosis, superior division (series 9, image 54). Left MCA branches perfused distally. Right M1 segment patent without significant stenosis. Normal right MCA bifurcation. Distal right MCA branches well perfused. Posterior circulation: Both V4 segments patent to the vertebrobasilar junction without stenosis. Neither PICA well visualized. Focal non stenotic plaque noted at the vertebrobasilar junction. Basilar widely patent distally to its distal aspect. Superior cerebellar arteries patent bilaterally. Both PCAs primarily supplied via the basilar. Short-segment moderate proximal P2 stenosis on the left (series 9, image 54). Additional scattered atheromatous irregularity  within the PCAs without high-grade stenosis. Venous sinuses: Grossly patent allowing for timing the contrast bolus. Anatomic variants: None significant.  No aneurysm. Review of the MIP images confirms the above findings CT Brain Perfusion Findings: ASPECTS: 10. CBF (<30%) Volume: 048mPerfusion (Tmax>6.0s) volume: 47m747mismatch Volume: 47mL22mfarction Location:Negative CT perfusion with no evidence for acute core infarct or other perfusion deficit. IMPRESSION: 1. Negative CTA for emergent large vessel occlusion. 2. Negative CT perfusion with no evidence for acute core infarct or other perfusion deficit. 3. Moderate atherosclerotic disease about the major arterial vasculature of the head and neck including moderate proximal left M2 and P2 stenoses. Critical Value/emergent results were called by telephone at the time of interpretation on 03/27/2020 at 8:35 pm to provider [facility] verbally acknowledged these results. Electronically Signed   By: BenjJeannine Boga.   On: 03/27/2020 21:08   DG Chest Port 1 View  Result Date: 03/27/2020 CLINICAL DATA:  Right arm and right leg weakness. EXAM: PORTABLE CHEST 1 VIEW COMPARISON:  April 25, 2009 FINDINGS: The heart size and mediastinal contours are within normal limits. Both lungs are clear. The visualized skeletal structures are unremarkable. IMPRESSION: No active disease. Electronically  Signed   By: Virgina Norfolk M.D.   On: 03/27/2020 21:05   CT HEAD CODE STROKE WO CONTRAST  Result Date: 03/27/2020 CLINICAL DATA:  Code stroke. Initial evaluation for increasing right-sided weakness. EXAM: CT HEAD WITHOUT CONTRAST TECHNIQUE: Contiguous axial images were obtained from the base of the skull through the vertex without intravenous contrast. COMPARISON:  Prior MRI from 04/18/2018 FINDINGS: Brain: Age-related cerebral atrophy with chronic small vessel ischemic disease. Chronic left cerebral infarcts involving the periventricular left frontal region. Few  scattered superimposed parenchymal calcifications. No acute intracranial hemorrhage. No acute large vessel territory infarct. No mass lesion, midline shift or mass effect. No hydrocephalus or extra-axial fluid collection. Vascular: No hyperdense vessel. Scattered vascular calcifications noted within the carotid siphons. Skull: Scalp soft tissues and calvarium within normal limits. Sinuses/Orbits: Globes and orbital soft tissues demonstrate no acute finding. Visualized paranasal sinuses are clear. No mastoid effusion. Other: None. ASPECTS [facility] Stroke Program Early CT Score) - Ganglionic level infarction (caudate, lentiform nuclei, internal capsule, insula, M1-M3 cortex): 7 - Supraganglionic infarction (M4-M6 cortex): 3 Total score (0-10 with 10 being normal): 10 IMPRESSION: 1. No acute intracranial infarct or other abnormality. 2. ASPECTS is 10. 3. 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 4. Underlying atrophy with chronic small vessel ischemic disease. Critical Value/emergent results were called by telephone at the time of interpretation on 03/27/2020 at 8:04 pm to provider [facility] , who verbally acknowledged these results. Electronically Signed   By: Jeannine Boga M.D.   On: 03/27/2020 20:09   CT ANGIO HEAD CODE STROKE  Result Date: 03/27/2020 CLINICAL DATA:  Initial evaluation for acute stroke, increased left-sided weakness. EXAM: CT ANGIOGRAPHY HEAD AND NECK CT PERFUSION BRAIN TECHNIQUE: Multidetector CT imaging of the head and neck was performed using the standard protocol during bolus administration of intravenous contrast. Multiplanar CT image reconstructions and MIPs were obtained to evaluate the vascular anatomy. Carotid stenosis measurements (when applicable) are obtained utilizing NASCET criteria, using the distal internal carotid diameter as the denominator. Multiphase CT imaging of the brain was performed following IV bolus contrast injection.  Subsequent parametric perfusion maps were calculated using RAPID software. CONTRAST:  142m OMNIPAQUE IOHEXOL 350 MG/ML SOLN COMPARISON:  Prior head CT from earlier the same day. FINDINGS: CTA NECK FINDINGS Aortic arch: Visualized aortic arch of normal caliber with normal 3 vessel morphology. Mild atheromatous change about the arch and origin of the great vessels without hemodynamically significant stenosis. Visualized subclavian arteries widely patent. Right carotid system: Left carotid system: Right common carotid artery patent from its origin to the bifurcation without stenosis. Mild atheromatous change about the right bifurcation/proximal right ICA without significant stenosis. Right ICA patent distally without stenosis, dissection or occlusion. Vertebral arteries: Left common carotid artery patent from its origin to the bifurcation. Eccentric calcified plaque at the left carotid bulb/proximal left ICA without significant stenosis. Left ICA patent distally without stenosis, dissection or occlusion. Skeleton: Both vertebral arteries arise from subclavian arteries. No proximal subclavian artery stenosis. Right vertebral artery dominant. Vertebral arteries patent within the neck without stenosis, dissection or occlusion. Other neck: No visible acute osseous finding. No discrete or worrisome osseous lesions. Patient is edentulous. Soft tissues: No other acute soft tissue abnormality within the neck. No mass or adenopathy. Upper chest: Visualized upper chest demonstrates no acute finding. 4 mm nodular density along the right major fissure favored to reflect a small intra pulmonic lymph node. Review of the MIP images confirms the above findings CTA HEAD FINDINGS  Anterior circulation: Petrous segments patent bilaterally. Scattered atheromatous plaque within the carotid siphons with no more than mild multifocal stenosis. A1 segments patent bilaterally. Normal anterior communicating artery complex. Anterior cerebral  arteries patent to their distal aspects without stenosis. Left M1 patent proximally. Focal moderate proximal M2 stenosis, superior division (series 9, image 54). Left MCA branches perfused distally. Right M1 segment patent without significant stenosis. Normal right MCA bifurcation. Distal right MCA branches well perfused. Posterior circulation: Both V4 segments patent to the vertebrobasilar junction without stenosis. Neither PICA well visualized. Focal non stenotic plaque noted at the vertebrobasilar junction. Basilar widely patent distally to its distal aspect. Superior cerebellar arteries patent bilaterally. Both PCAs primarily supplied via the basilar. Short-segment moderate proximal P2 stenosis on the left (series 9, image 54). Additional scattered atheromatous irregularity within the PCAs without high-grade stenosis. Venous sinuses: Grossly patent allowing for timing the contrast bolus. Anatomic variants: None significant.  No aneurysm. Review of the MIP images confirms the above findings CT Brain Perfusion Findings: ASPECTS: 10. CBF (<30%) Volume: 32m Perfusion (Tmax>6.0s) volume: 078mMismatch Volume: 43m89mnfarction Location:Negative CT perfusion with no evidence for acute core infarct or other perfusion deficit. IMPRESSION: 1. Negative CTA for emergent large vessel occlusion. 2. Negative CT perfusion with no evidence for acute core infarct or other perfusion deficit. 3. Moderate atherosclerotic disease about the major arterial vasculature of the head and neck including moderate proximal left M2 and P2 stenoses. Critical Value/emergent results were called by telephone at the time of interpretation on 03/27/2020 at 8:35 pm to provider [facility] Hospitalwho verbally acknowledged these results. Electronically Signed   By: BenJeannine BogaD.   On: 03/27/2020 21:08   CT ANGIO NECK CODE STROKE  Result Date: 03/27/2020 CLINICAL DATA:  Initial evaluation for acute stroke, increased left-sided weakness. EXAM:  CT ANGIOGRAPHY HEAD AND NECK CT PERFUSION BRAIN TECHNIQUE: Multidetector CT imaging of the head and neck was performed using the standard protocol during bolus administration of intravenous contrast. Multiplanar CT image reconstructions and MIPs were obtained to evaluate the vascular anatomy. Carotid stenosis measurements (when applicable) are obtained utilizing NASCET criteria, using the distal internal carotid diameter as the denominator. Multiphase CT imaging of the brain was performed following IV bolus contrast injection. Subsequent parametric perfusion maps were calculated using RAPID software. CONTRAST:  1043m79mNIPAQUE IOHEXOL 350 MG/ML SOLN COMPARISON:  Prior head CT from earlier the same day. FINDINGS: CTA NECK FINDINGS Aortic arch: Visualized aortic arch of normal caliber with normal 3 vessel morphology. Mild atheromatous change about the arch and origin of the great vessels without hemodynamically significant stenosis. Visualized subclavian arteries widely patent. Right carotid system: Left carotid system: Right common carotid artery patent from its origin to the bifurcation without stenosis. Mild atheromatous change about the right bifurcation/proximal right ICA without significant stenosis. Right ICA patent distally without stenosis, dissection or occlusion. Vertebral arteries: Left common carotid artery patent from its origin to the bifurcation. Eccentric calcified plaque at the left carotid bulb/proximal left ICA without significant stenosis. Left ICA patent distally without stenosis, dissection or occlusion. Skeleton: Both vertebral arteries arise from subclavian arteries. No proximal subclavian artery stenosis. Right vertebral artery dominant. Vertebral arteries patent within the neck without stenosis, dissection or occlusion. Other neck: No visible acute osseous finding. No discrete or worrisome osseous lesions. Patient is edentulous. Soft tissues: No other acute soft tissue abnormality within the  neck. No mass or adenopathy. Upper chest: Visualized upper chest demonstrates no acute finding. 4 mm nodular density  along the right major fissure favored to reflect a small intra pulmonic lymph node. Review of the MIP images confirms the above findings CTA HEAD FINDINGS Anterior circulation: Petrous segments patent bilaterally. Scattered atheromatous plaque within the carotid siphons with no more than mild multifocal stenosis. A1 segments patent bilaterally. Normal anterior communicating artery complex. Anterior cerebral arteries patent to their distal aspects without stenosis. Left M1 patent proximally. Focal moderate proximal M2 stenosis, superior division (series 9, image 54). Left MCA branches perfused distally. Right M1 segment patent without significant stenosis. Normal right MCA bifurcation. Distal right MCA branches well perfused. Posterior circulation: Both V4 segments patent to the vertebrobasilar junction without stenosis. Neither PICA well visualized. Focal non stenotic plaque noted at the vertebrobasilar junction. Basilar widely patent distally to its distal aspect. Superior cerebellar arteries patent bilaterally. Both PCAs primarily supplied via the basilar. Short-segment moderate proximal P2 stenosis on the left (series 9, image 54). Additional scattered atheromatous irregularity within the PCAs without high-grade stenosis. Venous sinuses: Grossly patent allowing for timing the contrast bolus. Anatomic variants: None significant.  No aneurysm. Review of the MIP images confirms the above findings CT Brain Perfusion Findings: ASPECTS: 10. CBF (<30%) Volume: 766m Perfusion (Tmax>6.0s) volume: 057mMismatch Volume: 66m78mnfarction Location:Negative CT perfusion with no evidence for acute core infarct or other perfusion deficit. IMPRESSION: 1. Negative CTA for emergent large vessel occlusion. 2. Negative CT perfusion with no evidence for acute core infarct or other perfusion deficit. 3. Moderate  atherosclerotic disease about the major arterial vasculature of the head and neck including moderate proximal left M2 and P2 stenoses. Critical Value/emergent results were called by telephone at the time of interpretation on 03/27/2020 at 8:35 pm to provider [facility] LLCwho verbally acknowledged these results. Electronically Signed   By: BenJeannine BogaD.   On: 03/27/2020 21:08      Labs: BNP (last 3 results) No results for input(s): BNP in the last 8760 hours. Basic Metabolic Panel: Recent Labs  Lab 03/27/20 2000  NA 134*  K 3.5  CL 101  CO2 23  GLUCOSE 131*  BUN 11  CREATININE 1.07*  CALCIUM 9.0   Liver Function Tests: Recent Labs  Lab 03/27/20 2000  AST 31  ALT 20  ALKPHOS 65  BILITOT 0.9  PROT 8.3*  ALBUMIN 4.3   No results for input(s): LIPASE, AMYLASE in the last 168 hours. No results for input(s): AMMONIA in the last 168 hours. CBC: Recent Labs  Lab 03/27/20 2000  WBC 8.0  NEUTROABS 6.0  HGB 14.9  HCT 44.7  MCV 81.7  PLT 204   Cardiac Enzymes: Recent Labs  Lab 03/28/20 0630  CKTOTAL 612*   BNP: Invalid input(s): POCBNP CBG: Recent Labs  Lab 03/27/20 1955 03/28/20 0002 03/28/20 0844 03/28/20 1210 03/28/20 1603  GLUCAP 131* 124* 89 92 131*   D-Dimer Recent Labs    03/27/20 2000  DDIMER 0.66*   Hgb A1c Recent Labs    03/27/20 2000  HGBA1C 6.8*   Lipid Profile Recent Labs    03/28/20 0630  CHOL 95  HDL 34*  LDLCALC 44  TRIG 85  CHOLHDL 2.8   Thyroid function studies Recent Labs    03/27/20 2000  TSH 1.001   Anemia work up Recent Labs    03/28/20 0630  VITAMINB12 334   Urinalysis No results found for: COLORURINE, APPEARANCEUR, LABSPEC, PHURosineLUCOSEU, HGBAnchorageILIRUBINUR, KETONESUR, PROTEINUR, UROBILINOGEN, NITRITE, LEUKOCYTESUR Sepsis Labs Invalid input(s): PROCALCITONIN,  WBC,  LACTICIDVEN Microbiology Recent Results (  from the past 240 hour(s))  Resp Panel by RT-PCR (Flu A&B, Covid)  Nasopharyngeal Swab     Status: Abnormal   Collection Time: 03/27/20  8:00 PM   Specimen: Nasopharyngeal Swab; Nasopharyngeal(NP) swabs in vial transport medium  Result Value Ref Range Status   SARS Coronavirus 2 by RT PCR NEGATIVE NEGATIVE Final    Comment: (NOTE) SARS-CoV-2 target nucleic acids are NOT DETECTED.  The SARS-CoV-2 RNA is generally detectable in upper respiratory specimens during the acute phase of infection. The lowest concentration of SARS-CoV-2 viral copies this assay can detect is 138 copies/mL. A negative result does not preclude SARS-Cov-2 infection and should not be used as the sole basis for treatment or other patient management decisions. A negative result may occur with  improper specimen collection/handling, submission of specimen other than nasopharyngeal swab, presence of viral mutation(s) within the areas targeted by this assay, and inadequate number of viral copies(<138 copies/mL). A negative result must be combined with clinical observations, patient history, and epidemiological information. The expected result is Negative.  Fact Sheet for Patients:  EntrepreneurPulse.com.au  Fact Sheet for Healthcare Providers:  IncredibleEmployment.be  This test is no t yet approved or cleared by the [place] FDA and  has been authorized for detection and/or diagnosis of SARS-CoV-2 by FDA under an Emergency Use Authorization (EUA). This EUA will remain  in effect (meaning this test can be used) for the duration of the COVID-19 declaration under Section 564(b)(1) of the Act, 21 U.S.C.section 360bbb-3(b)(1), unless the authorization is terminated  or revoked sooner.       Influenza A by PCR POSITIVE (A) NEGATIVE Final   Influenza B by PCR NEGATIVE NEGATIVE Final    Comment: (NOTE) The Xpert Xpress SARS-CoV-2/FLU/RSV plus assay is intended as an aid in the diagnosis of influenza from Nasopharyngeal swab specimens  and should not be used as a sole basis for treatment. Nasal washings and aspirates are unacceptable for Xpert Xpress SARS-CoV-2/FLU/RSV testing.  Fact Sheet for Patients: EntrepreneurPulse.com.au  Fact Sheet for Healthcare Providers: IncredibleEmployment.be  This test is not yet approved or cleared by the [place] FDA and has been authorized for detection and/or diagnosis of SARS-CoV-2 by FDA under an Emergency Use Authorization (EUA). This EUA will remain in effect (meaning this test can be used) for the duration of the COVID-19 declaration under Section 564(b)(1) of the Act, 21 U.S.C. section 360bbb-3(b)(1), unless the authorization is terminated or revoked.  Performed at [facility], [address] [place], [place] [address]   Blood Culture (routine x 2)     Status: None (Preliminary result)   Collection Time: 03/27/20  8:47 PM   Specimen: BLOOD  Result Value Ref Range Status   Specimen Description BLOOD LEFT ANTECUBITAL  Final   Special Requests   Final    BOTTLES DRAWN AEROBIC AND ANAEROBIC Blood Culture adequate volume   Culture   Final    NO GROWTH < 12 HOURS Performed at [facility], [address] [place], [place] [address]    Report Status PENDING  Incomplete  Blood Culture (routine x 2)     Status: None (Preliminary result)   Collection Time: 03/27/20  8:47 PM   Specimen: BLOOD  Result Value Ref Range Status   Specimen Description BLOOD LEFT ANTECUBITAL  Final   Special Requests   Final    BOTTLES DRAWN AEROBIC AND ANAEROBIC Blood Culture adequate volume   Culture  Setup Time   Final    Organism ID  to follow [place] CRITICAL RESULT CALLED TO, READ BACK BY AND VERIFIED WITH: SUSAN WATSON AT [place] ON 03/28/20 BY SS Performed at [facility], [address] [place], [place] [address]    Culture [facility] POSITIVE COCCI  Final   Report Status PENDING  Incomplete   Blood Culture ID Panel (Reflexed)     Status: Abnormal   Collection Time: 03/27/20  8:47 PM  Result Value Ref Range Status   Enterococcus faecalis NOT DETECTED NOT DETECTED Final   Enterococcus Faecium NOT DETECTED NOT DETECTED Final   Listeria monocytogenes NOT DETECTED NOT DETECTED Final   Staphylococcus species DETECTED (A) NOT DETECTED Final    Comment: CRITICAL RESULT CALLED TO, READ BACK BY AND VERIFIED WITH: SUSAN WATSON AT 1706 ON 03/28/20 BY SS    Staphylococcus aureus (BCID) NOT DETECTED NOT DETECTED Final   Staphylococcus epidermidis DETECTED (A) NOT DETECTED Final    Comment: CRITICAL RESULT CALLED TO, READ BACK BY AND VERIFIED WITH: SUSAN WATSON AT 5170 ON 03/28/20 BY SS    Staphylococcus lugdunensis NOT DETECTED NOT DETECTED Final   Streptococcus species NOT DETECTED NOT DETECTED Final   Streptococcus agalactiae NOT DETECTED NOT DETECTED Final   Streptococcus pneumoniae NOT DETECTED NOT DETECTED Final   Streptococcus pyogenes NOT DETECTED NOT DETECTED Final   A.calcoaceticus-baumannii NOT DETECTED NOT DETECTED Final   Bacteroides fragilis NOT DETECTED NOT DETECTED Final   Enterobacterales NOT DETECTED NOT DETECTED Final   Enterobacter cloacae complex NOT DETECTED NOT DETECTED Final   Escherichia coli NOT DETECTED NOT DETECTED Final   Klebsiella aerogenes NOT DETECTED NOT DETECTED Final   Klebsiella oxytoca NOT DETECTED NOT DETECTED Final   Klebsiella pneumoniae NOT DETECTED NOT DETECTED Final   Proteus species NOT DETECTED NOT DETECTED Final   Salmonella species NOT DETECTED NOT DETECTED Final   Serratia marcescens NOT DETECTED NOT DETECTED Final   Haemophilus influenzae NOT DETECTED NOT DETECTED Final   Neisseria meningitidis NOT DETECTED NOT DETECTED Final   Pseudomonas aeruginosa NOT DETECTED NOT DETECTED Final   Stenotrophomonas maltophilia NOT DETECTED NOT DETECTED Final   Candida albicans NOT DETECTED NOT DETECTED Final   Candida auris NOT DETECTED NOT DETECTED  Final   Candida glabrata NOT DETECTED NOT DETECTED Final   Candida krusei NOT DETECTED NOT DETECTED Final   Candida parapsilosis NOT DETECTED NOT DETECTED Final   Candida tropicalis NOT DETECTED NOT DETECTED Final   Cryptococcus neoformans/gattii NOT DETECTED NOT DETECTED Final   Methicillin resistance mecA/C NOT DETECTED NOT DETECTED Final    Comment: Performed at [facility], [address] [place], [place] [address]     Total time spend on discharging this patient, including the last patient exam, discussing the hospital stay, instructions for ongoing care as it relates to all pertinent caregivers, as well as preparing the medical discharge records, prescriptions, and/or referrals as applicable, is 45 minutes.    Enzo Bi, MD  [facility] 03/28/2020, 5:42 PM

## 2020-03-28 NOTE — Consult Note (Signed)
Stroke Neurology Consultation Note  Consult Requested by: Dr. Billie Ruddy  Reason for Consult: worsening right sided weakness  Consult Date: 03/28/20   The history was obtained from the pt.  During history and examination, all items were able to obtain unless otherwise noted.  History of Present Illness:  Brandi Rojas is a 65 y.o. African American female with PMH of DM, HTN, stroke in 03/2018 presented to ED last night for worsening right sided weakness. Per pt, she has not been feeling well for the last 2-3 days. She had cough, feeling sick, not able to think right. She was normally able to walk with walker but for the last 2-3 days she was weaker on the right and not able to walk. She also has poor po intake all day yesterday. She came to ER for evaluation. Teleneurology saw her last night and she is not tPA candidate due to outside window. CT head no acute finding. CTA head and neck showed chronic left MCA and PCA stenosis.  Pt had stroke in 03/2018, MRI showed left MCA and MCA/ACA infarcts. MRA showed left M2 occlusion, but also has b/l PCA stenosis, L>R on my review. EF 55-60%. A1c 11.8 and LDL 173. UDS positive for THC. She was diagnosed with DM back then. At baseline, she has no movement but contracture of RUE, on R foot brace for walking.   LSN: 2-3 days ago tPA Given: No: outside window  Past Medical History:  Diagnosis Date  . Diabetes mellitus without complication ([place])   . DM II (diabetes mellitus, type II), controlled ([place]) 03/27/2020  . Hypertension   . Stroke [facility])     Past Surgical History:  Procedure Laterality Date  . none      Family History  Problem Relation Age of Onset  . Diabetes Mother   . CVA Neg Hx   . Hypertension Neg Hx     Social History:  reports that she quit smoking about 1 years ago. Her smoking use included cigarettes. She smoked 0.50 packs per day. She has never used smokeless tobacco. She reports previous alcohol use of about 7.0 standard drinks of alcohol  per week. She reports that she does not use drugs.  Allergies: No Known Allergies  No current facility-administered medications on file prior to encounter.   Current Outpatient Medications on File Prior to Encounter  Medication Sig Dispense Refill  . amLODipine (NORVASC) 5 MG tablet Take 1 tablet by mouth daily.    Marland Kitchen aspirin EC 81 MG EC tablet Take 1 tablet (81 mg total) by mouth daily. 120 tablet 0  . baclofen (LIORESAL) 10 MG tablet Take 1-2 tablets by mouth daily as needed for muscle spasms.    . clopidogrel (PLAVIX) 75 MG tablet Take 1 tablet (75 mg total) by mouth daily. 30 tablet 0  . insulin glargine (LANTUS SOLOSTAR) 100 UNIT/ML Solostar Pen Inject 13 Units into the skin at bedtime.    . rosuvastatin (CRESTOR) 40 MG tablet Take 1 tablet by mouth at bedtime.    Marland Kitchen amLODipine (NORVASC) 5 MG tablet Take 1 tablet (5 mg total) by mouth daily. 30 tablet 0  . blood glucose meter kit and supplies KIT Dispense based on patient and insurance preference. Use up to four times daily as directed. (FOR ICD-9 250.00, 250.01). 1 each 0  . insulin glargine (LANTUS) 100 UNIT/ML injection 10 units at night daily 10 mL 3  . rosuvastatin (CRESTOR) 40 MG tablet Take 1 tablet (40 mg total) by mouth at  bedtime. 30 tablet 0    Review of Systems: A full ROS was attempted today and was able to be performed.  Systems assessed include - Constitutional, Eyes, HENT, Respiratory, Cardiovascular, Gastrointestinal, Genitourinary, Integument/breast, Hematologic/lymphatic, Musculoskeletal, Neurological, Behavioral/Psych, Endocrine, Allergic/Immunologic - with pertinent responses as per HPI.  Physical Examination: Temp:  [98.1 F (36.7 C)-101.3 F (38.5 C)] 99.4 F (37.4 C) (03/07 1209) Pulse Rate:  [70-116] 76 (03/07 1209) Resp:  [18-36] 20 (03/07 1209) BP: (115-146)/(56-93) 122/71 (03/07 1209) SpO2:  [92 %-100 %] 94 % (03/07 1209) Weight:  [72.6 kg] 72.6 kg (03/07 0001)  General - well nourished, well developed,  in no apparent distress.    Ophthalmologic - fundi not visualized due to noncooperation.    Cardiovascular - regular rhythm and rate  Mental Status -  Level of arousal and orientation to time, place, and person were intact. Language including expression, naming, repetition, comprehension was assessed and found intact.  Cranial Nerves II - XII - II - Vision intact OU. III, IV, VI - Extraocular movements intact. V - Facial sensation intact bilaterally. VII - right facial droop. VIII - Hearing & vestibular intact bilaterally. X - Palate elevates symmetrically. XI - Chin turning & shoulder shrug intact bilaterally. XII - Tongue protrusion intact.  Motor Strength - The patient's strength was normal in LUE and LLE extremities, however, RUE 0/5 with contracture and increased muscle tone. RLE proximal 5/5, knee extension 5/5, ankle DF/PF 3-/5. Mildly increase tone on the RLE   Reflexes - The patient's reflexes were increased on the right and she had no pathological reflexes.  Sensory - Light touch, temperature/pinprick were assessed and were symmetrical.    Coordination - The patient had normal movements in the left hand with no ataxia or dysmetria.  Tremor was absent.  Gait and Station - deferred  Data Reviewed: CT CEREBRAL PERFUSION W CONTRAST  Result Date: 03/27/2020 CLINICAL DATA:  Initial evaluation for acute stroke, increased left-sided weakness. EXAM: CT ANGIOGRAPHY HEAD AND NECK CT PERFUSION BRAIN TECHNIQUE: Multidetector CT imaging of the head and neck was performed using the standard protocol during bolus administration of intravenous contrast. Multiplanar CT image reconstructions and MIPs were obtained to evaluate the vascular anatomy. Carotid stenosis measurements (when applicable) are obtained utilizing NASCET criteria, using the distal internal carotid diameter as the denominator. Multiphase CT imaging of the brain was performed following IV bolus contrast injection. Subsequent  parametric perfusion maps were calculated using RAPID software. CONTRAST:  169m OMNIPAQUE IOHEXOL 350 MG/ML SOLN COMPARISON:  Prior head CT from earlier the same day. FINDINGS: CTA NECK FINDINGS Aortic arch: Visualized aortic arch of normal caliber with normal 3 vessel morphology. Mild atheromatous change about the arch and origin of the great vessels without hemodynamically significant stenosis. Visualized subclavian arteries widely patent. Right carotid system: Left carotid system: Right common carotid artery patent from its origin to the bifurcation without stenosis. Mild atheromatous change about the right bifurcation/proximal right ICA without significant stenosis. Right ICA patent distally without stenosis, dissection or occlusion. Vertebral arteries: Left common carotid artery patent from its origin to the bifurcation. Eccentric calcified plaque at the left carotid bulb/proximal left ICA without significant stenosis. Left ICA patent distally without stenosis, dissection or occlusion. Skeleton: Both vertebral arteries arise from subclavian arteries. No proximal subclavian artery stenosis. Right vertebral artery dominant. Vertebral arteries patent within the neck without stenosis, dissection or occlusion. Other neck: No visible acute osseous finding. No discrete or worrisome osseous lesions. Patient is edentulous. Soft tissues: No other  acute soft tissue abnormality within the neck. No mass or adenopathy. Upper chest: Visualized upper chest demonstrates no acute finding. 4 mm nodular density along the right major fissure favored to reflect a small intra pulmonic lymph node. Review of the MIP images confirms the above findings CTA HEAD FINDINGS Anterior circulation: Petrous segments patent bilaterally. Scattered atheromatous plaque within the carotid siphons with no more than mild multifocal stenosis. A1 segments patent bilaterally. Normal anterior communicating artery complex. Anterior cerebral arteries patent  to their distal aspects without stenosis. Left M1 patent proximally. Focal moderate proximal M2 stenosis, superior division (series 9, image 54). Left MCA branches perfused distally. Right M1 segment patent without significant stenosis. Normal right MCA bifurcation. Distal right MCA branches well perfused. Posterior circulation: Both V4 segments patent to the vertebrobasilar junction without stenosis. Neither PICA well visualized. Focal non stenotic plaque noted at the vertebrobasilar junction. Basilar widely patent distally to its distal aspect. Superior cerebellar arteries patent bilaterally. Both PCAs primarily supplied via the basilar. Short-segment moderate proximal P2 stenosis on the left (series 9, image 54). Additional scattered atheromatous irregularity within the PCAs without high-grade stenosis. Venous sinuses: Grossly patent allowing for timing the contrast bolus. Anatomic variants: None significant.  No aneurysm. Review of the MIP images confirms the above findings CT Brain Perfusion Findings: ASPECTS: 10. CBF (<30%) Volume: 767m Perfusion (Tmax>6.0s) volume: 0167mMismatch Volume: 67m9mnfarction Location:Negative CT perfusion with no evidence for acute core infarct or other perfusion deficit. IMPRESSION: 1. Negative CTA for emergent large vessel occlusion. 2. Negative CT perfusion with no evidence for acute core infarct or other perfusion deficit. 3. Moderate atherosclerotic disease about the major arterial vasculature of the head and neck including moderate proximal left M2 and P2 stenoses. Critical Value/emergent results were called by telephone at the time of interpretation on 03/27/2020 at 8:35 pm to provider [facility]who verbally acknowledged these results. Electronically Signed   By: BenJeannine BogaD.   On: 03/27/2020 21:08   DG Chest Port 1 View  Result Date: 03/27/2020 CLINICAL DATA:  Right arm and right leg weakness. EXAM: PORTABLE CHEST 1 VIEW COMPARISON:  April 25, 2009  FINDINGS: The heart size and mediastinal contours are within normal limits. Both lungs are clear. The visualized skeletal structures are unremarkable. IMPRESSION: No active disease. Electronically Signed   By: ThaVirgina NorfolkD.   On: 03/27/2020 21:05   CT HEAD CODE STROKE WO CONTRAST  Result Date: 03/27/2020 CLINICAL DATA:  Code stroke. Initial evaluation for increasing right-sided weakness. EXAM: CT HEAD WITHOUT CONTRAST TECHNIQUE: Contiguous axial images were obtained from the base of the skull through the vertex without intravenous contrast. COMPARISON:  Prior MRI from 04/18/2018 FINDINGS: Brain: Age-related cerebral atrophy with chronic small vessel ischemic disease. Chronic left cerebral infarcts involving the periventricular left frontal region. Few scattered superimposed parenchymal calcifications. No acute intracranial hemorrhage. No acute large vessel territory infarct. No mass lesion, midline shift or mass effect. No hydrocephalus or extra-axial fluid collection. Vascular: No hyperdense vessel. Scattered vascular calcifications noted within the carotid siphons. Skull: Scalp soft tissues and calvarium within normal limits. Sinuses/Orbits: Globes and orbital soft tissues demonstrate no acute finding. Visualized paranasal sinuses are clear. No mastoid effusion. Other: None. ASPECTS ([facility] Program Early CT Score) - Ganglionic level infarction (caudate, lentiform nuclei, internal capsule, insula, M1-M3 cortex): 7 - Supraganglionic infarction (M4-M6 cortex): 3 Total score (0-10 with 10 being normal): 10 IMPRESSION: 1. No acute intracranial infarct or other abnormality. 2. ASPECTS is 10. 3.  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 4. Underlying atrophy with chronic small vessel ischemic disease. Critical Value/emergent results were called by telephone at the time of interpretation on 03/27/2020 at 8:04 pm to provider [facility] , who verbally acknowledged these  results. Electronically Signed   By: Jeannine Boga M.D.   On: 03/27/2020 20:09   CT ANGIO HEAD CODE STROKE  Result Date: 03/27/2020 CLINICAL DATA:  Initial evaluation for acute stroke, increased left-sided weakness. EXAM: CT ANGIOGRAPHY HEAD AND NECK CT PERFUSION BRAIN TECHNIQUE: Multidetector CT imaging of the head and neck was performed using the standard protocol during bolus administration of intravenous contrast. Multiplanar CT image reconstructions and MIPs were obtained to evaluate the vascular anatomy. Carotid stenosis measurements (when applicable) are obtained utilizing NASCET criteria, using the distal internal carotid diameter as the denominator. Multiphase CT imaging of the brain was performed following IV bolus contrast injection. Subsequent parametric perfusion maps were calculated using RAPID software. CONTRAST:  174m OMNIPAQUE IOHEXOL 350 MG/ML SOLN COMPARISON:  Prior head CT from earlier the same day. FINDINGS: CTA NECK FINDINGS Aortic arch: Visualized aortic arch of normal caliber with normal 3 vessel morphology. Mild atheromatous change about the arch and origin of the great vessels without hemodynamically significant stenosis. Visualized subclavian arteries widely patent. Right carotid system: Left carotid system: Right common carotid artery patent from its origin to the bifurcation without stenosis. Mild atheromatous change about the right bifurcation/proximal right ICA without significant stenosis. Right ICA patent distally without stenosis, dissection or occlusion. Vertebral arteries: Left common carotid artery patent from its origin to the bifurcation. Eccentric calcified plaque at the left carotid bulb/proximal left ICA without significant stenosis. Left ICA patent distally without stenosis, dissection or occlusion. Skeleton: Both vertebral arteries arise from subclavian arteries. No proximal subclavian artery stenosis. Right vertebral artery dominant. Vertebral arteries patent  within the neck without stenosis, dissection or occlusion. Other neck: No visible acute osseous finding. No discrete or worrisome osseous lesions. Patient is edentulous. Soft tissues: No other acute soft tissue abnormality within the neck. No mass or adenopathy. Upper chest: Visualized upper chest demonstrates no acute finding. 4 mm nodular density along the right major fissure favored to reflect a small intra pulmonic lymph node. Review of the MIP images confirms the above findings CTA HEAD FINDINGS Anterior circulation: Petrous segments patent bilaterally. Scattered atheromatous plaque within the carotid siphons with no more than mild multifocal stenosis. A1 segments patent bilaterally. Normal anterior communicating artery complex. Anterior cerebral arteries patent to their distal aspects without stenosis. Left M1 patent proximally. Focal moderate proximal M2 stenosis, superior division (series 9, image 54). Left MCA branches perfused distally. Right M1 segment patent without significant stenosis. Normal right MCA bifurcation. Distal right MCA branches well perfused. Posterior circulation: Both V4 segments patent to the vertebrobasilar junction without stenosis. Neither PICA well visualized. Focal non stenotic plaque noted at the vertebrobasilar junction. Basilar widely patent distally to its distal aspect. Superior cerebellar arteries patent bilaterally. Both PCAs primarily supplied via the basilar. Short-segment moderate proximal P2 stenosis on the left (series 9, image 54). Additional scattered atheromatous irregularity within the PCAs without high-grade stenosis. Venous sinuses: Grossly patent allowing for timing the contrast bolus. Anatomic variants: None significant.  No aneurysm. Review of the MIP images confirms the above findings CT Brain Perfusion Findings: ASPECTS: 10. CBF (<30%) Volume: 032mPerfusion (Tmax>6.0s) volume: 20m15mismatch Volume: 20mL83mfarction Location:Negative CT perfusion with no evidence  for acute core infarct or other perfusion deficit. IMPRESSION: 1.  Negative CTA for emergent large vessel occlusion. 2. Negative CT perfusion with no evidence for acute core infarct or other perfusion deficit. 3. Moderate atherosclerotic disease about the major arterial vasculature of the head and neck including moderate proximal left M2 and P2 stenoses. Critical Value/emergent results were called by telephone at the time of interpretation on 03/27/2020 at 8:35 pm to provider [facility] , who verbally acknowledged these results. Electronically Signed   By: Jeannine Boga M.D.   On: 03/27/2020 21:08   CT ANGIO NECK CODE STROKE  Result Date: 03/27/2020 CLINICAL DATA:  Initial evaluation for acute stroke, increased left-sided weakness. EXAM: CT ANGIOGRAPHY HEAD AND NECK CT PERFUSION BRAIN TECHNIQUE: Multidetector CT imaging of the head and neck was performed using the standard protocol during bolus administration of intravenous contrast. Multiplanar CT image reconstructions and MIPs were obtained to evaluate the vascular anatomy. Carotid stenosis measurements (when applicable) are obtained utilizing NASCET criteria, using the distal internal carotid diameter as the denominator. Multiphase CT imaging of the brain was performed following IV bolus contrast injection. Subsequent parametric perfusion maps were calculated using RAPID software. CONTRAST:  118m OMNIPAQUE IOHEXOL 350 MG/ML SOLN COMPARISON:  Prior head CT from earlier the same day. FINDINGS: CTA NECK FINDINGS Aortic arch: Visualized aortic arch of normal caliber with normal 3 vessel morphology. Mild atheromatous change about the arch and origin of the great vessels without hemodynamically significant stenosis. Visualized subclavian arteries widely patent. Right carotid system: Left carotid system: Right common carotid artery patent from its origin to the bifurcation without stenosis. Mild atheromatous change about the right bifurcation/proximal  right ICA without significant stenosis. Right ICA patent distally without stenosis, dissection or occlusion. Vertebral arteries: Left common carotid artery patent from its origin to the bifurcation. Eccentric calcified plaque at the left carotid bulb/proximal left ICA without significant stenosis. Left ICA patent distally without stenosis, dissection or occlusion. Skeleton: Both vertebral arteries arise from subclavian arteries. No proximal subclavian artery stenosis. Right vertebral artery dominant. Vertebral arteries patent within the neck without stenosis, dissection or occlusion. Other neck: No visible acute osseous finding. No discrete or worrisome osseous lesions. Patient is edentulous. Soft tissues: No other acute soft tissue abnormality within the neck. No mass or adenopathy. Upper chest: Visualized upper chest demonstrates no acute finding. 4 mm nodular density along the right major fissure favored to reflect a small intra pulmonic lymph node. Review of the MIP images confirms the above findings CTA HEAD FINDINGS Anterior circulation: Petrous segments patent bilaterally. Scattered atheromatous plaque within the carotid siphons with no more than mild multifocal stenosis. A1 segments patent bilaterally. Normal anterior communicating artery complex. Anterior cerebral arteries patent to their distal aspects without stenosis. Left M1 patent proximally. Focal moderate proximal M2 stenosis, superior division (series 9, image 54). Left MCA branches perfused distally. Right M1 segment patent without significant stenosis. Normal right MCA bifurcation. Distal right MCA branches well perfused. Posterior circulation: Both V4 segments patent to the vertebrobasilar junction without stenosis. Neither PICA well visualized. Focal non stenotic plaque noted at the vertebrobasilar junction. Basilar widely patent distally to its distal aspect. Superior cerebellar arteries patent bilaterally. Both PCAs primarily supplied via the  basilar. Short-segment moderate proximal P2 stenosis on the left (series 9, image 54). Additional scattered atheromatous irregularity within the PCAs without high-grade stenosis. Venous sinuses: Grossly patent allowing for timing the contrast bolus. Anatomic variants: None significant.  No aneurysm. Review of the MIP images confirms the above findings CT Brain Perfusion Findings: ASPECTS: 10. CBF (<30%) Volume:  67m Perfusion (Tmax>6.0s) volume: 07mMismatch Volume: 71m88mnfarction Location:Negative CT perfusion with no evidence for acute core infarct or other perfusion deficit. IMPRESSION: 1. Negative CTA for emergent large vessel occlusion. 2. Negative CT perfusion with no evidence for acute core infarct or other perfusion deficit. 3. Moderate atherosclerotic disease about the major arterial vasculature of the head and neck including moderate proximal left M2 and P2 stenoses. Critical Value/emergent results were called by telephone at the time of interpretation on 03/27/2020 at 8:35 pm to provider [facility]who verbally acknowledged these results. Electronically Signed   By: BenJeannine BogaD.   On: 03/27/2020 21:08    Assessment: [age]o. female with PMH of DM, HTN, stroke in 03/2018 and right sided residue presented to ED last night for worsening right sided weakness in the setting of fever, dehydration and also found to have flu. Not tPA candidate due to outside window. CT head no acute finding. CTA head and neck showed chronic left MCA and PCA stenosis. CTP negative. Pt now afebrile, stated improving from yesterday, although not totally back to baseline yet. Pt condition most likely recrudescence of her old stroke in the setting of flu, fever, dehydration. Will check MRI to rule out new stroke given her risk factors. Continue DAPT and statin. Continue medical management per primary team.  Stroke Risk Factors - diabetes mellitus, hypertension and history of stroke  Plan: - MRI of the brain  without contrast - PT consult, OT consult  - Echocardiogram pending - Prophylactic therapy- continue ASA and plavix and statin home med.  - Risk factor modification - Telemetry monitoring - Frequent neuro checks - will follow  Thank you for this consultation and allowing us [place] participate in the care of this patient.  JinRosalin HawkingD PhD Stroke Neurology 03/28/2020 12:35 PM

## 2020-03-28 NOTE — Progress Notes (Incomplete)
STROKE TEAM PROGRESS NOTE   SUBJECTIVE (INTERVAL HISTORY) Her *** is at the bedside.  Overall her condition is {course:17::"unchanged"}. ***   OBJECTIVE Temp:  [98.1 F (36.7 C)-101.3 F (38.5 C)] 99.4 F (37.4 C) (03/07 1209) Pulse Rate:  [70-116] 76 (03/07 1209) Cardiac Rhythm: Normal sinus rhythm (03/07 0700) Resp:  [18-36] 20 (03/07 1209) BP: (115-146)/(56-93) 122/71 (03/07 1209) SpO2:  [92 %-100 %] 94 % (03/07 1209) Weight:  [72.6 kg] 72.6 kg (03/07 0001)  Recent Labs  Lab 03/27/20 1955 03/28/20 0002 03/28/20 0844 03/28/20 1210  GLUCAP 131* 124* 89 92   Recent Labs  Lab 03/27/20 2000  NA 134*  K 3.5  CL 101  CO2 23  GLUCOSE 131*  BUN 11  CREATININE 1.07*  CALCIUM 9.0   Recent Labs  Lab 03/27/20 2000  AST 31  ALT 20  ALKPHOS 65  BILITOT 0.9  PROT 8.3*  ALBUMIN 4.3   Recent Labs  Lab 03/27/20 2000  WBC 8.0  NEUTROABS 6.0  HGB 14.9  HCT 44.7  MCV 81.7  PLT 204   Recent Labs  Lab 03/28/20 0630  CKTOTAL 612*   Recent Labs    03/27/20 2000  LABPROT 13.4  INR 1.1   No results for input(s): COLORURINE, LABSPEC, PHURINE, GLUCOSEU, HGBUR, BILIRUBINUR, KETONESUR, PROTEINUR, UROBILINOGEN, NITRITE, LEUKOCYTESUR in the last 72 hours.  Invalid input(s): APPERANCEUR     Component Value Date/Time   CHOL 95 03/28/2020 0630   TRIG 85 03/28/2020 0630   HDL 34 (L) 03/28/2020 0630   CHOLHDL 2.8 03/28/2020 0630   VLDL 17 03/28/2020 0630   LDLCALC 44 03/28/2020 0630   Lab Results  Component Value Date   HGBA1C 6.8 (H) 03/27/2020      Component Value Date/Time   LABOPIA NONE DETECTED 04/18/2018 1701   COCAINSCRNUR NONE DETECTED 04/18/2018 1701   LABBENZ NONE DETECTED 04/18/2018 1701   AMPHETMU NONE DETECTED 04/18/2018 1701   THCU POSITIVE (A) 04/18/2018 1701   LABBARB NONE DETECTED 04/18/2018 1701    No results for input(s): ETH in the last 168 hours.  I have personally reviewed the radiological images below and agree with the radiology  interpretations.  CT CEREBRAL PERFUSION W CONTRAST  Result Date: 03/27/2020 CLINICAL DATA:  Initial evaluation for acute stroke, increased left-sided weakness. EXAM: CT ANGIOGRAPHY HEAD AND NECK CT PERFUSION BRAIN TECHNIQUE: Multidetector CT imaging of the head and neck was performed using the standard protocol during bolus administration of intravenous contrast. Multiplanar CT image reconstructions and MIPs were obtained to evaluate the vascular anatomy. Carotid stenosis measurements (when applicable) are obtained utilizing NASCET criteria, using the distal internal carotid diameter as the denominator. Multiphase CT imaging of the brain was performed following IV bolus contrast injection. Subsequent parametric perfusion maps were calculated using RAPID software. CONTRAST:  OMNIPAQUE IOHEXOL 350 MG/ML SOLN COMPARISON:  Prior head CT from earlier the same day. FINDINGS: CTA NECK FINDINGS Aortic arch: Visualized aortic arch of normal caliber with normal 3 vessel morphology. Mild atheromatous change about the arch and origin of the great vessels without hemodynamically significant stenosis. Visualized subclavian arteries widely patent. Right carotid system: Left carotid system: Right common carotid artery patent from its origin to the bifurcation without stenosis. Mild atheromatous change about the right bifurcation/proximal right ICA without significant stenosis. Right ICA patent distally without stenosis, dissection or occlusion. Vertebral arteries: Left common carotid artery patent from its origin to the bifurcation. Eccentric calcified plaque at the left carotid bulb/proximal left ICA without significant  stenosis. Left ICA patent distally without stenosis, dissection or occlusion. Skeleton: Both vertebral arteries arise from subclavian arteries. No proximal subclavian artery stenosis. Right vertebral artery dominant. Vertebral arteries patent within the neck without stenosis, dissection or occlusion. Other  neck: No visible acute osseous finding. No discrete or worrisome osseous lesions. Patient is edentulous. Soft tissues: No other acute soft tissue abnormality within the neck. No mass or adenopathy. Upper chest: Visualized upper chest demonstrates no acute finding. 4 mm nodular density along the right major fissure favored to reflect a small intra pulmonic lymph node. Review of the MIP images confirms the above findings CTA HEAD FINDINGS Anterior circulation: Petrous segments patent bilaterally. Scattered atheromatous plaque within the carotid siphons with no more than mild multifocal stenosis. A1 segments patent bilaterally. Normal anterior communicating artery complex. Anterior cerebral arteries patent to their distal aspects without stenosis. Left M1 patent proximally. Focal moderate proximal M2 stenosis, superior division (series 9, image 54). Left MCA branches perfused distally. Right M1 segment patent without significant stenosis. Normal right MCA bifurcation. Distal right MCA branches well perfused. Posterior circulation: Both V4 segments patent to the vertebrobasilar junction without stenosis. Neither PICA well visualized. Focal non stenotic plaque noted at the vertebrobasilar junction. Basilar widely patent distally to its distal aspect. Superior cerebellar arteries patent bilaterally. Both PCAs primarily supplied via the basilar. Short-segment moderate proximal P2 stenosis on the left (series 9, image 54). Additional scattered atheromatous irregularity within the PCAs without high-grade stenosis. Venous sinuses: Grossly patent allowing for timing the contrast bolus. Anatomic variants: None significant.  No aneurysm. Review of the MIP images confirms the above findings CT Brain Perfusion Findings: ASPECTS: 10. CBF (<30%) Volume: 91mL Perfusion (Tmax>6.0s) volume: 54mL Mismatch Volume: 1mL Infarction Location:Negative CT perfusion with no evidence for acute core infarct or other perfusion deficit. IMPRESSION:  1. Negative CTA for emergent large vessel occlusion. 2. Negative CT perfusion with no evidence for acute core infarct or other perfusion deficit. 3. Moderate atherosclerotic disease about the major arterial vasculature of the head and neck including moderate proximal left M2 and P2 stenoses. Critical Value/emergent results were called by telephone at the time of interpretation on 03/27/2020 at 8:35 pm to provider [facility] , who verbally acknowledged these results. Electronically Signed   By: Rise Mu M.D.   On: 03/27/2020 21:08   DG Chest Port 1 View  Result Date: 03/27/2020 CLINICAL DATA:  Right arm and right leg weakness. EXAM: PORTABLE CHEST 1 VIEW COMPARISON:  April 25, 2009 FINDINGS: The heart size and mediastinal contours are within normal limits. Both lungs are clear. The visualized skeletal structures are unremarkable. IMPRESSION: No active disease. Electronically Signed   By: Aram Candela M.D.   On: 03/27/2020 21:05   CT HEAD CODE STROKE WO CONTRAST  Result Date: 03/27/2020 CLINICAL DATA:  Code stroke. Initial evaluation for increasing right-sided weakness. EXAM: CT HEAD WITHOUT CONTRAST TECHNIQUE: Contiguous axial images were obtained from the base of the skull through the vertex without intravenous contrast. COMPARISON:  Prior MRI from 04/18/2018 FINDINGS: Brain: Age-related cerebral atrophy with chronic small vessel ischemic disease. Chronic left cerebral infarcts involving the periventricular left frontal region. Few scattered superimposed parenchymal calcifications. No acute intracranial hemorrhage. No acute large vessel territory infarct. No mass lesion, midline shift or mass effect. No hydrocephalus or extra-axial fluid collection. Vascular: No hyperdense vessel. Scattered vascular calcifications noted within the carotid siphons. Skull: Scalp soft tissues and calvarium within normal limits. Sinuses/Orbits: Globes and orbital soft tissues demonstrate no acute finding.  Visualized paranasal sinuses are clear. No mastoid effusion. Other: None. ASPECTS [facility]([place] Stroke Program Early CT Score) - Ganglionic level infarction (caudate, lentiform nuclei, internal capsule, insula, M1-M3 cortex): 7 - Supraganglionic infarction (M4-M6 cortex): 3 Total score (0-10 with 10 being normal): 10 IMPRESSION: 1. No acute intracranial infarct or other abnormality. 2. ASPECTS is 10. 3. 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 4. Underlying atrophy with chronic small vessel ischemic disease. Critical Value/emergent results were called by telephone at the time of interpretation on 03/27/2020 at 8:04 pm to [facility] , who verbally acknowledged these results. Electronically Signed   By: Rise MuBenjamin  McClintock M.D.   On: 03/27/2020 20:09   CT ANGIO HEAD CODE STROKE  Result Date: 03/27/2020 CLINICAL DATA:  Initial evaluation for acute stroke, increased left-sided weakness. EXAM: CT ANGIOGRAPHY HEAD AND NECK CT PERFUSION BRAIN TECHNIQUE: Multidetector CT imaging of the head and neck was performed using the standard protocol during bolus administration of intravenous contrast. Multiplanar CT image reconstructions and MIPs were obtained to evaluate the vascular anatomy. Carotid stenosis measurements (when applicable) are obtained utilizing NASCET criteria, using the distal internal carotid diameter as the denominator. Multiphase CT imaging of the brain was performed following IV bolus contrast injection. Subsequent parametric perfusion maps were calculated using RAPID software. CONTRAST:  100mL OMNIPAQUE IOHEXOL 350 MG/ML SOLN COMPARISON:  Prior head CT from earlier the same day. FINDINGS: CTA NECK FINDINGS Aortic arch: Visualized aortic arch of normal caliber with normal 3 vessel morphology. Mild atheromatous change about the arch and origin of the great vessels without hemodynamically significant stenosis. Visualized subclavian arteries widely patent. Right  carotid system: Left carotid system: Right common carotid artery patent from its origin to the bifurcation without stenosis. Mild atheromatous change about the right bifurcation/proximal right ICA without significant stenosis. Right ICA patent distally without stenosis, dissection or occlusion. Vertebral arteries: Left common carotid artery patent from its origin to the bifurcation. Eccentric calcified plaque at the left carotid bulb/proximal left ICA without significant stenosis. Left ICA patent distally without stenosis, dissection or occlusion. Skeleton: Both vertebral arteries arise from subclavian arteries. No proximal subclavian artery stenosis. Right vertebral artery dominant. Vertebral arteries patent within the neck without stenosis, dissection or occlusion. Other neck: No visible acute osseous finding. No discrete or worrisome osseous lesions. Patient is edentulous. Soft tissues: No other acute soft tissue abnormality within the neck. No mass or adenopathy. Upper chest: Visualized upper chest demonstrates no acute finding. 4 mm nodular density along the right major fissure favored to reflect a small intra pulmonic lymph node. Review of the MIP images confirms the above findings CTA HEAD FINDINGS Anterior circulation: Petrous segments patent bilaterally. Scattered atheromatous plaque within the carotid siphons with no more than mild multifocal stenosis. A1 segments patent bilaterally. Normal anterior communicating artery complex. Anterior cerebral arteries patent to their distal aspects without stenosis. Left M1 patent proximally. Focal moderate proximal M2 stenosis, superior division (series 9, image 54). Left MCA branches perfused distally. Right M1 segment patent without significant stenosis. Normal right MCA bifurcation. Distal right MCA branches well perfused. Posterior circulation: Both V4 segments patent to the vertebrobasilar junction without stenosis. Neither PICA well visualized. Focal non  stenotic plaque noted at the vertebrobasilar junction. Basilar widely patent distally to its distal aspect. Superior cerebellar arteries patent bilaterally. Both PCAs primarily supplied via the basilar. Short-segment moderate proximal P2 stenosis on the left (series 9, image 54). Additional scattered atheromatous irregularity within the PCAs without high-grade stenosis. Venous sinuses: Grossly  patent allowing for timing the contrast bolus. Anatomic variants: None significant.  No aneurysm. Review of the MIP images confirms the above findings CT Brain Perfusion Findings: ASPECTS: 10. CBF (<30%) Volume: 0mL Perfusion (Tmax>6.0s) volume: 0mL Mismatch Volume: 0mL Infarction Location:Negative CT perfusion with no evidence for acute core infarct or other perfusion deficit. IMPRESSION: 1. Negative CTA for emergent large vessel occlusion. 2. Negative CT perfusion with no evidence for acute core infarct or other perfusion deficit. 3. Moderate atherosclerotic disease about the major arterial vasculature of the head and neck including moderate proximal left M2 and P2 stenoses. Critical Value/emergent results were called by telephone at the time of interpretation on 03/27/2020 at 8:35 pm to provider [facility] , who verbally acknowledged these results. Electronically Signed   By: Rise Mu M.D.   On: 03/27/2020 21:08   CT ANGIO NECK CODE STROKE  Result Date: 03/27/2020 CLINICAL DATA:  Initial evaluation for acute stroke, increased left-sided weakness. EXAM: CT ANGIOGRAPHY HEAD AND NECK CT PERFUSION BRAIN TECHNIQUE: Multidetector CT imaging of the head and neck was performed using the standard protocol during bolus administration of intravenous contrast. Multiplanar CT image reconstructions and MIPs were obtained to evaluate the vascular anatomy. Carotid stenosis measurements (when applicable) are obtained utilizing NASCET criteria, using the distal internal carotid diameter as the denominator. Multiphase CT  imaging of the brain was performed following IV bolus contrast injection. Subsequent parametric perfusion maps were calculated using RAPID software. CONTRAST:  OMNIPAQUE IOHEXOL 350 MG/ML SOLN COMPARISON:  Prior head CT from earlier the same day. FINDINGS: CTA NECK FINDINGS Aortic arch: Visualized aortic arch of normal caliber with normal 3 vessel morphology. Mild atheromatous change about the arch and origin of the great vessels without hemodynamically significant stenosis. Visualized subclavian arteries widely patent. Right carotid system: Left carotid system: Right common carotid artery patent from its origin to the bifurcation without stenosis. Mild atheromatous change about the right bifurcation/proximal right ICA without significant stenosis. Right ICA patent distally without stenosis, dissection or occlusion. Vertebral arteries: Left common carotid artery patent from its origin to the bifurcation. Eccentric calcified plaque at the left carotid bulb/proximal left ICA without significant stenosis. Left ICA patent distally without stenosis, dissection or occlusion. Skeleton: Both vertebral arteries arise from subclavian arteries. No proximal subclavian artery stenosis. Right vertebral artery dominant. Vertebral arteries patent within the neck without stenosis, dissection or occlusion. Other neck: No visible acute osseous finding. No discrete or worrisome osseous lesions. Patient is edentulous. Soft tissues: No other acute soft tissue abnormality within the neck. No mass or adenopathy. Upper chest: Visualized upper chest demonstrates no acute finding. 4 mm nodular density along the right major fissure favored to reflect a small intra pulmonic lymph node. Review of the MIP images confirms the above findings CTA HEAD FINDINGS Anterior circulation: Petrous segments patent bilaterally. Scattered atheromatous plaque within the carotid siphons with no more than mild multifocal stenosis. A1 segments patent  bilaterally. Normal anterior communicating artery complex. Anterior cerebral arteries patent to their distal aspects without stenosis. Left M1 patent proximally. Focal moderate proximal M2 stenosis, superior division (series 9, image 54). Left MCA branches perfused distally. Right M1 segment patent without significant stenosis. Normal right MCA bifurcation. Distal right MCA branches well perfused. Posterior circulation: Both V4 segments patent to the vertebrobasilar junction without stenosis. Neither PICA well visualized. Focal non stenotic plaque noted at the vertebrobasilar junction. Basilar widely patent distally to its distal aspect. Superior cerebellar arteries patent bilaterally. Both PCAs primarily supplied via the basilar.  Short-segment moderate proximal P2 stenosis on the left (series 9, image 54). Additional scattered atheromatous irregularity within the PCAs without high-grade stenosis. Venous sinuses: Grossly patent allowing for timing the contrast bolus. Anatomic variants: None significant.  No aneurysm. Review of the MIP images confirms the above findings CT Brain Perfusion Findings: ASPECTS: 10. CBF (<30%) Volume: 0mL Perfusion (Tmax>6.0s) volume: 0mL Mismatch Volume: 0mL Infarction Location:Negative CT perfusion with no evidence for acute core infarct or other perfusion deficit. IMPRESSION: 1. Negative CTA for emergent large vessel occlusion. 2. Negative CT perfusion with no evidence for acute core infarct or other perfusion deficit. 3. Moderate atherosclerotic disease about the major arterial vasculature of the head and neck including moderate proximal left M2 and P2 stenoses. Critical Value/emergent results were called by telephone at the time of interpretation on 03/27/2020 at 8:35 pm to provider [facility] , who verbally acknowledged these results. Electronically Signed   By: Rise Mu M.D.   On: 03/27/2020 21:08    PHYSICAL EXAM  Temp:  [98.1 F (36.7 C)-101.3 F (38.5 C)]  99.4 F (37.4 C) (03/07 1209) Pulse Rate:  [70-116] 76 (03/07 1209) Resp:  [18-36] 20 (03/07 1209) BP: (115-146)/(56-93) 122/71 (03/07 1209) SpO2:  [92 %-100 %] 94 % (03/07 1209) Weight:  [72.6 kg] 72.6 kg (03/07 0001)  General - well nourished, well developed, in no apparent distress.    Ophthalmologic - fundi not visualized due to noncooperation.    Cardiovascular - regular rhythm and rate  Mental Status -  Level of arousal and orientation to time, place, and person were intact. Language including expression, naming, repetition, comprehension was assessed and found intact.  Cranial Nerves II - XII - II - Vision intact OU. III, IV, VI - Extraocular movements intact. V - Facial sensation intact bilaterally. VII - right facial droop. VIII - Hearing & vestibular intact bilaterally. X - Palate elevates symmetrically. XI - Chin turning & shoulder shrug intact bilaterally. XII - Tongue protrusion intact.  Motor Strength - The patient's strength was normal in LUE and LLE extremities, however, RUE 0/5 with contracture and increased muscle tone. RLE proximal 5/5, knee extension 5/5, ankle DF/PF 3-/5. Mildly increase tone on the RLE   Reflexes - The patient's reflexes were increased on the right and she had no pathological reflexes.  Sensory - Light touch, temperature/pinprick were assessed and were symmetrical.    Coordination - The patient had normal movements in the left hand with no ataxia or dysmetria.  Tremor was absent.  Gait and Station - deferred   ASSESSMENT/PLAN Ms. Brandi Rojas is a 65 y.o. female with history of *** admitted for ***. No tPA given due to ***.    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  CTP neg  MRI  ***  2D Echo  ***  LDL 44  HgbA1c 6.8  UDS ***  Heparin subq for VTE  prophylaxis  aspirin 81 mg daily and clopidogrel 75 mg daily prior to admission, now on aspirin 81 mg daily and clopidogrel 75 mg daily.   Patient counseled to be compliant with her antithrombotic medications  Ongoing aggressive stroke risk factor management  Therapy recommendations:  ***  Disposition:  ***  Hx of stroke  03/2018, MRI showed left MCA and MCA/ACA infarcts. MRA showed left M2 occlusion, but also has b/l P2 stenosis, L>R on  my review. EF 55-60%. A1c 11.8 and LDL 173. UDS positive for THC. DM diagnosis was new at that time. She was discharged with DAPT and stain.   At baseline, she has no movement but contracture of RUE, on R foot brace for walking with walker.  Flu dehydration  Fever 101.3 on presentation -> afebrile now  Cre 1.07  Flu A+  On tamiflu   Diabetes  HgbA1c 6.8 goal < 7.0  Controlled  CBG monitoring  SSI  close PCP follow up  Hypertension . Stable  Long term BP goal normotensive  Hyperlipidemia  Home meds:  crestor 40   LDL 44, goal < 70  Now on crestor 40  Continue statin at discharge  Other Stroke Risk Factors  Former Cigarette smoker, quit one year ago  Hx of THC use  Other Active Problems  ***  Hospital day # 1    Brandi Rojas, Brandi Rojas Stroke Neurology 03/28/2020 12:37 PM    To contact Stroke Continuity provider, please refer to [email or web address]. After hours, contact General Neurology

## 2020-03-28 NOTE — Evaluation (Signed)
Occupational Therapy Evaluation Patient Details Name: Brandi Rojas MRN: [id number] DOB: January 04, 1956 Today's Date: 03/28/2020    History of Present Illness Patient is a 40 female the past medical history of diabetes, hypertension, CVA with right-sided deficits presents to the emergency department for increased right upper extremity weakness and fatigue. Patient found to be febrile tachycardic and tachypneic meeting sepsis criteria.  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 Possibly  recrudescence of her old stroke in the setting of flu, fever, dehydration with MRI of brain pending per neurology note.   Clinical Impression   Pt seen for OT evaluation this date. Upon arrival to room, pt awake and seated upright in bed. Pt agreeable to eval and reporting no pain. Prior to admission, pt reports she was independent with dressing and home maintenance (i.e., cooking and laundry). Pt reports that she typically bathes herself via spongebathing and takes baths a couple times a week. Patient reports she walks with quad cane and AFO on RLE (she donn/doffs herself). Pt currently requires MIN A (via HHA) for functional mobility and toilet transfer without AD/DME, MIN GUARD for sit to/from stand toilet hygiene, and SUPERVISION/SET-UP for standing grooming and seated UB dressing. Pt reports that she feels like she is nearing her baseline. Pt would benefit from additional skilled OT services to maximize return to PLOF and prevent further deconditioning. Upon discharge, recommend supervision/assistance from family as needed.    Follow Up Recommendations  No OT follow up;Supervision - Intermittent    Equipment Recommendations  None recommended by OT       Precautions / Restrictions Precautions Precautions: Fall Restrictions Weight Bearing Restrictions: No      Mobility Bed Mobility Overal bed mobility:  Modified Independent                  Transfers Overall transfer level: Needs assistance   Transfers: Sit to/from Stand Sit to Stand: Min guard         General transfer comment: Min guard for safety. patient donned right AFO with assistance from therapist    Balance Overall balance assessment: Needs assistance Sitting-balance support: No upper extremity supported;Feet supported Sitting balance-Leahy Scale: Good Sitting balance - Comments: Able to sit EOB to engage in UB dressing; no LOB observed   Standing balance support: Single extremity supported;During functional activity Standing balance-Leahy Scale: Fair Standing balance comment: Single UE support via 1-person HHA for functional mobility and toilet transfer                           ADL either performed or assessed with clinical judgement   ADL Overall ADL's : Needs assistance/impaired                                       General ADL Comments: MIN A (via HHA) for functional mobility and toilet transfer without AD/DME. MIN GUARD for sit to/from stand toilet hygiene. SUPERVISION/SET-UP for standing grooming and seated UB dressing.                  Pertinent Vitals/Pain Pain Assessment: No/denies pain     Hand Dominance Right (has to use left hand primarly now d/t residual weakness from old stroke)   Extremity/Trunk Assessment Upper Extremity Assessment Upper Extremity Assessment: Overall WFL for tasks assessed;RUE  deficits/detail RUE Deficits / Details: 0/5 shoulder flexion, 3/5 elbow flexion, unable to fully extend elbow d/t hypertonicity, 2/5 grip strength RUE Sensation: WNL   Lower Extremity Assessment Lower Extremity Assessment: Defer to PT evaluation        Communication Communication Communication: No difficulties   Cognition Arousal/Alertness: Awake/alert Behavior During Therapy: Flat affect Overall Cognitive Status: No family/caregiver present to determine  baseline cognitive functioning                                 General Comments: Pt oriented to self, place, and situation (pt said year was 2002 and was unsure of age, initially guessing 89). Patient agreeable and able to follow single step commands without difficulty.      Exercises Other Exercises Other Exercises: Provided education on role of OT and POC        Home Living Family/patient expects to be discharged to:: Private residence Living Arrangements: Children Available Help at Discharge: Available PRN/intermittently;Family Type of Home: House Home Access: Level entry     Home Layout: Able to live on main level with bedroom/bathroom;Multi-level Alternate Level Stairs-Number of Steps: 15   Bathroom Shower/Tub: Tub/shower unit         Home Equipment: Cane - quad;Other (comment) (AFO for RLE)      Lives With: Daughter    Prior Functioning/Environment Level of Independence: Independent with assistive device(s)        Comments: Pt reports she is independent with dressing and home maintenance (i.e., cooking and laundry). Pt reports that she typically bathes herself via spongebathing (takes baths a couple times a week). Patient reports she walks with quad cane and AFO on RLE (she donn/doffs herself)        OT Problem List: Decreased strength;Decreased activity tolerance;Impaired balance (sitting and/or standing);Impaired UE functional use      OT Treatment/Interventions: Self-care/ADL training;Therapeutic exercise;Energy conservation;DME and/or AE instruction;Therapeutic activities;Patient/family education;Balance training    OT Goals(Current goals can be found in the care plan section) Acute Rehab OT Goals Patient Stated Goal: to go home OT Goal Formulation: With patient Time For Goal Achievement: 04/11/20 Potential to Achieve Goals: Good ADL Goals Pt Will Perform Grooming: with modified independence;standing Pt Will Transfer to Toilet: with  modified independence;ambulating;regular height toilet Pt Will Perform Toileting - Clothing Manipulation and hygiene: with modified independence;sit to/from stand  OT Frequency: Min 1X/week    AM-PAC OT "6 Clicks" Daily Activity     Outcome Measure Help from another person eating meals?: None Help from another person taking care of personal grooming?: A Little Help from another person toileting, which includes using toliet, bedpan, or urinal?: A Little Help from another person bathing (including washing, rinsing, drying)?: A Little Help from another person to put on and taking off regular upper body clothing?: A Little Help from another person to put on and taking off regular lower body clothing?: A Little 6 Click Score: 19   End of Session Equipment Utilized During Treatment: Gait belt Nurse Communication: Mobility status  Activity Tolerance: Patient tolerated treatment well Patient left: in bed;with call bell/phone within reach;with bed alarm set  OT Visit Diagnosis: Unsteadiness on feet (R26.81);Muscle weakness (generalized) (M62.81)                Time: 3474-2595 OT Time Calculation (min): 33 min Charges:  OT General Charges $OT Visit: 1 Visit OT Evaluation $OT Eval Moderate Complexity: 1 Mod OT Treatments $Self Care/Home  Management : 8-22 mins  Matthew Folks, OTR/L [facility] [phone]

## 2020-03-28 NOTE — Plan of Care (Signed)
  Problem: Coping: Goal: Will verbalize positive feelings about self Outcome: Progressing   Problem: Education: Goal: Knowledge of patient specific risk factors addressed and post discharge goals established will improve Outcome: Progressing   Problem: Education: Goal: Knowledge of secondary prevention will improve Outcome: Progressing   Problem: Education: Goal: Knowledge of disease or condition will improve Outcome: Progressing   

## 2020-03-28 NOTE — Progress Notes (Signed)
Pt being followed by [facility] for Code Sepsis.

## 2020-03-28 NOTE — Evaluation (Signed)
Physical Therapy Evaluation Patient Details Name: Brandi Rojas MRN: [id number] DOB: 04-16-1955 Today's Date: 03/28/2020   History of Present Illness  Patient is a 89 female the past medical history of diabetes, hypertension, CVA with right-sided deficits presents to the emergency department for increased right upper extremity weakness and fatigue. Patient found to be febrile tachycardic and tachypneic meeting sepsis criteria.  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 Possibly  recrudescence of her old stroke in the setting of flu, fever, dehydration with MRI of brain pending per neurology note.   Clinical Impression  PT evaluation completed. Patient has residual right side weakness from prior stroke and uses an AFO for ambulation at baseline and quad cane. Patient required assistance for donning AFO and was able to ambulate in room with hand held assistance, Min A initially progressing to Min guard with increased ambulation distance. Sp02 86% briefly while ambulating on room air, increased quickly to 90's with rest. Patient reports she feels like she is nearing her baseline. Recommend PT follow up to maximize independence and address functional limitations listed below. HHPT recommended at discharge.      Follow Up Recommendations Home health PT    Equipment Recommendations  None recommended by PT    Recommendations for Other Services       Precautions / Restrictions Precautions Precautions: Fall Restrictions Weight Bearing Restrictions: No      Mobility  Bed Mobility Overal bed mobility: Modified Independent                  Transfers Overall transfer level: Needs assistance   Transfers: Sit to/from Stand Sit to Stand: Min guard         General transfer comment: Min guard for safety. patient donned right AFO with assistance from  therapist  Ambulation/Gait Ambulation/Gait assistance: Min guard;Min assist Gait Distance (Feet): 25 Feet Assistive device: 1 person hand held assist Gait Pattern/deviations: Decreased stance time - right;Decreased step length - right;Decreased weight shift to right Gait velocity: decreased   General Gait Details: hand held assistance on left side provided for safety. Min A initially progressing to Min guard with increased ambulation distance.  Stairs            Wheelchair Mobility    Modified Rankin (Stroke Patients Only)       Balance Overall balance assessment: Needs assistance Sitting-balance support: Feet supported Sitting balance-Leahy Scale: Good     Standing balance support: Single extremity supported Standing balance-Leahy Scale: Fair                               Pertinent Vitals/Pain Pain Assessment: No/denies pain    Home Living Family/patient expects to be discharged to:: Private residence Living Arrangements: Children Available Help at Discharge: Available PRN/intermittently;Family Type of Home: House Home Access: Level entry     Home Layout: Able to live on main level with bedroom/bathroom Home Equipment: Cane - quad (AFO for RLE)      Prior Function Level of Independence: Independent with assistive device(s)         Comments: patient reports she ambulates with quad cane and AFO on RLE which she donn/doffs herself     Hand Dominance   Dominant Hand: Right (has to use left hand primarly now d/t residual weakness from old stroke)    Extremity/Trunk Assessment   Upper Extremity Assessment  Upper Extremity Assessment: Defer to OT evaluation (trace movement noted in RUE with hypertonicity. see OT note for details)    Lower Extremity Assessment Lower Extremity Assessment: LLE deficits/detail;RLE deficits/detail RLE Deficits / Details: knee extension 4+/5, dorsiflexion 1/5, plantarflexion 2/5. mild hypertonicity. patient has  previous stroke with residual right side deficits RLE Sensation: WNL LLE Deficits / Details: 5/5 knee extension, dorsiflexion/plantarflexion 5/5 LLE Sensation: WNL       Communication   Communication: No difficulties  Cognition Arousal/Alertness: Awake/alert Behavior During Therapy: WFL for tasks assessed/performed Overall Cognitive Status: No family/caregiver present to determine baseline cognitive functioning                                 General Comments: patient able to follow single step commands without difficulty      General Comments      Exercises     Assessment/Plan    PT Assessment Patient needs continued PT services  PT Problem List Decreased strength;Decreased activity tolerance;Decreased balance;Decreased mobility;Decreased safety awareness       PT Treatment Interventions DME instruction;Gait training;Stair training;Functional mobility training;Therapeutic activities;Therapeutic exercise;Balance training;Neuromuscular re-education    PT Goals (Current goals can be found in the Care Plan section)  Acute Rehab PT Goals Patient Stated Goal: to go home PT Goal Formulation: With patient Time For Goal Achievement: 04/11/20 Potential to Achieve Goals: Good    Frequency Min 2X/week   Barriers to discharge        Co-evaluation               AM-PAC PT "6 Clicks" Mobility  Outcome Measure Help needed turning from your back to your side while in a flat bed without using bedrails?: A Little Help needed moving from lying on your back to sitting on the side of a flat bed without using bedrails?: A Little Help needed moving to and from a bed to a chair (including a wheelchair)?: A Little Help needed standing up from a chair using your arms (e.g., wheelchair or bedside chair)?: A Little Help needed to walk in hospital room?: A Little Help needed climbing 3-5 steps with a railing? : A Little 6 Click Score: 18    End of Session Equipment  Utilized During Treatment: Gait belt Activity Tolerance: Patient tolerated treatment well Patient left: in chair;with call bell/phone within reach;with bed alarm set Nurse Communication: Mobility status PT Visit Diagnosis: Muscle weakness (generalized) (M62.81);Other abnormalities of gait and mobility (R26.89)    Time: 6967-8938 PT Time Calculation (min) (ACUTE ONLY): 20 min   Charges:   PT Evaluation $PT Eval Moderate Complexity: 1 Mod PT Treatments $Therapeutic Activity: 8-22 mins        Donna Bernard, PT, MPT    Ina Homes 03/28/2020, 1:52 PM

## 2020-03-28 NOTE — Progress Notes (Signed)
Speech Language Pathology Evaluation Patient Details Name: Brandi Rojas MRN: [id number] DOB: 1955/03/14 Today's Date: 03/28/2020 Time: 1030-1050 SLP Time Calculation (min) (ACUTE ONLY): 20 min  Problem List:  Patient Active Problem List   Diagnosis Date Noted   H/O: CVA (cerebrovascular accident) 03/27/2020   Weakness of extremity 03/27/2020   Sepsis (HCC) 03/27/2020   DM II (diabetes mellitus, type II), controlled (HCC) 03/27/2020   HTN (hypertension), benign 05/23/2018   Hyperlipidemia 05/23/2018   Past Medical History:  Past Medical History:  Diagnosis Date   Diabetes mellitus without complication (HCC)    DM II (diabetes mellitus, type II), controlled (HCC) 03/27/2020   Hypertension    Stroke [facility])    Past Surgical History:  Past Surgical History:  Procedure Laterality Date   none     HPI:  Patient is a 65 y.o. female with PMH of CVA with residual right sided weakness, DM II, HTN, HLD who was brought to ED with slurred speech. Found to have fever and influenza A. Code stroke was called; CT head and CT angio negative for acute findings. Teleneurology was consulted and suspected unmasking of old stroke symptoms due to infection, poor PO intake. Pt passed [employer] screen in the ED.   Assessment / Plan / Recommendation Clinical Impression   Patient appears to have cognitive-linguistic function skills that are at baseline. She was oriented to self, location, situation and month/year. Speech is fluent and clear without dysarthria, and she participates in simple-mod complex conversation without difficulty. Patient able to give history and information about her living situation; lives with her daughter who assists pt with medications. Administered some tasks from SLUMS; omitted tasks with writing component due to right sided weakness. Recall functional for list of 5 words (recalled 4/5 immediately and with delay) and for story retell (75%). Patient did not participate fully in  divergent naming, number sequencing tasks; SLP feels this is a reflection of the tasks not being salient for pt vs too difficult. For simple and personally-relevant tasks patient's recall, attention and problem solving appear within functional limits. At this time no further skilled ST services are indicated; SLP will s/o.     SLP Assessment  SLP Recommendation/Assessment: Patient does not need any further Speech Lanaguage Pathology Services SLP Visit Diagnosis: Dysarthria and anarthria (R47.1);Cognitive communication deficit (R41.841)    Follow Up Recommendations  None    Frequency and Duration  (eval only)         SLP Evaluation Cognition  Overall Cognitive Status: No family/caregiver present to determine baseline cognitive functioning Arousal/Alertness: Awake/alert Orientation Level: Oriented X4 Attention: Selective Selective Attention: Appears intact Memory:  (appears [facility]) Immediate Memory Recall:  (immediate 4/5 words, with delay 4/5) Awareness: Appears intact Problem Solving: Appears intact (for simple functional problems) Executive Function: Reasoning Reasoning: Appears intact Safety/Judgment: Appears intact       Comprehension  Auditory Comprehension Overall Auditory Comprehension: Appears within functional limits for tasks assessed Visual Recognition/Discrimination Discrimination: Not tested Reading Comprehension Reading Status: Not tested    Expression Expression Primary Mode of Expression: Verbal Verbal Expression Overall Verbal Expression: Appears within functional limits for tasks assessed Naming:  (named 5 animals in 60 seconds, but declined to continue after 15 seconds; not a personally relevant task for pt) Written Expression Dominant Hand: Left (due to R sided weakness from prior CVA) Written Expression: Not tested   Oral / Motor  Oral Motor/Sensory Function Overall Oral Motor/Sensory Function: Within functional limits Motor Speech Overall Motor  Speech: Appears within functional  limits for tasks assessed Respiration: Within functional limits Phonation: Normal Resonance: Within functional limits Articulation: Within functional limitis Intelligibility: Intelligible Motor Planning: Witnin functional limits Motor Speech Errors: Not applicable   GO                   Rondel Baton, MS, CCC-SLP Speech-Language Pathologist  Arlana Lindau 03/28/2020, 12:25 PM

## 2020-03-29 LAB — ANA W/REFLEX IF POSITIVE: Anti Nuclear Antibody (ANA): NEGATIVE

## 2020-03-29 NOTE — Progress Notes (Signed)
Patient discharged home yesterday. CSW left VM for patient requesting a return call regarding HHPT recommendation. Will arrange if patient agrees to [facility].  Alfonso Ramus, [place] [phone]

## 2020-03-30 LAB — CULTURE, BLOOD (ROUTINE X 2): Special Requests: ADEQUATE

## 2020-03-30 LAB — URINE CULTURE: Culture: NO GROWTH

## 2020-03-30 NOTE — Progress Notes (Addendum)
Left another VM for patient regarding Home Health. Checked with [facility], [facility], [facility], [facility], [facility], and [facility] who all reported they do not accept patient's insurance. Asked patient for return call if she would like HH arranged or OPPT if unable to find [facility] agency.  10:15- Spoke to patient and patient's daughter Aggie Cosier. They are interested in PT services. Confirmed home address. Reached out to [facility] since patient's insurance is affiliated with [facility]. Spoke with [facility] who reported they are at capacity but she will review referral to see if they can accommodate patient. Faxed orders.  3:05- Call from [facility] Representative Candace who reported they are unable to provide services to this patient. Left VM for patient's daughter.  Alfonso Ramus, [place] [phone]

## 2020-04-01 LAB — CULTURE, BLOOD (ROUTINE X 2)
Culture: NO GROWTH
Special Requests: ADEQUATE
